# Patient Record
Sex: Female | Born: 1975 | Race: White | Hispanic: No | Marital: Married | Smoking: Never smoker
Health system: Southern US, Community
[De-identification: ages and names within clinical notes are randomized; demographics above are authoritative.]

## PROBLEM LIST (undated history)

## (undated) ENCOUNTER — Inpatient Hospital Stay (HOSPITAL_COMMUNITY): Payer: Self-pay

## (undated) DIAGNOSIS — D649 Anemia, unspecified: Secondary | ICD-10-CM

## (undated) DIAGNOSIS — S3992XA Unspecified injury of lower back, initial encounter: Secondary | ICD-10-CM

## (undated) DIAGNOSIS — Z8632 Personal history of gestational diabetes: Secondary | ICD-10-CM

## (undated) DIAGNOSIS — O09299 Supervision of pregnancy with other poor reproductive or obstetric history, unspecified trimester: Secondary | ICD-10-CM

## (undated) DIAGNOSIS — Z8781 Personal history of (healed) traumatic fracture: Secondary | ICD-10-CM

## (undated) DIAGNOSIS — S52501A Unspecified fracture of the lower end of right radius, initial encounter for closed fracture: Secondary | ICD-10-CM

## (undated) DIAGNOSIS — R112 Nausea with vomiting, unspecified: Secondary | ICD-10-CM

## (undated) DIAGNOSIS — Z9889 Other specified postprocedural states: Secondary | ICD-10-CM

## (undated) DIAGNOSIS — R51 Headache: Secondary | ICD-10-CM

## (undated) DIAGNOSIS — Z87442 Personal history of urinary calculi: Secondary | ICD-10-CM

## (undated) HISTORY — DX: Anemia, unspecified: D64.9

## (undated) HISTORY — DX: Unspecified injury of lower back, initial encounter: S39.92XA

## (undated) HISTORY — DX: Personal history of urinary calculi: Z87.442

## (undated) HISTORY — PX: WISDOM TOOTH EXTRACTION: SHX21

## (undated) HISTORY — DX: Supervision of pregnancy with other poor reproductive or obstetric history, unspecified trimester: O09.299

## (undated) HISTORY — DX: Personal history of (healed) traumatic fracture: Z87.81

## (undated) HISTORY — PX: TONSILLECTOMY AND ADENOIDECTOMY: SHX28

## (undated) HISTORY — DX: Headache: R51

## (undated) HISTORY — DX: Personal history of gestational diabetes: Z86.32

---

## 2001-04-23 HISTORY — PX: CERVIX SURGERY: SHX593

## 2009-11-29 ENCOUNTER — Ambulatory Visit (HOSPITAL_COMMUNITY): Admission: AD | Admit: 2009-11-29 | Discharge: 2009-11-29 | Payer: Self-pay | Admitting: Obstetrics and Gynecology

## 2009-11-30 ENCOUNTER — Ambulatory Visit (HOSPITAL_COMMUNITY): Admission: RE | Admit: 2009-11-30 | Discharge: 2009-11-30 | Payer: Self-pay | Admitting: Obstetrics and Gynecology

## 2010-02-23 ENCOUNTER — Inpatient Hospital Stay (HOSPITAL_COMMUNITY): Admission: AD | Admit: 2010-02-23 | Discharge: 2010-02-23 | Payer: Self-pay | Admitting: Obstetrics and Gynecology

## 2010-02-28 ENCOUNTER — Inpatient Hospital Stay (HOSPITAL_COMMUNITY): Admission: AD | Admit: 2010-02-28 | Discharge: 2010-02-28 | Payer: Self-pay | Admitting: Obstetrics and Gynecology

## 2010-03-04 ENCOUNTER — Inpatient Hospital Stay (HOSPITAL_COMMUNITY): Admission: AD | Admit: 2010-03-04 | Discharge: 2010-03-06 | Payer: Self-pay | Admitting: Obstetrics and Gynecology

## 2010-07-04 LAB — CBC
Hemoglobin: 10.7 g/dL — ABNORMAL LOW (ref 12.0–15.0)
MCH: 31.4 pg (ref 26.0–34.0)
MCHC: 34 g/dL (ref 30.0–36.0)
MCV: 92.3 fL (ref 78.0–100.0)
Platelets: 180 10*3/uL (ref 150–400)
Platelets: 189 10*3/uL (ref 150–400)
RBC: 3.76 MIL/uL — ABNORMAL LOW (ref 3.87–5.11)
RDW: 12.8 % (ref 11.5–15.5)
WBC: 11.5 10*3/uL — ABNORMAL HIGH (ref 4.0–10.5)
WBC: 18.2 10*3/uL — ABNORMAL HIGH (ref 4.0–10.5)

## 2010-07-04 LAB — COMPREHENSIVE METABOLIC PANEL
AST: 28 U/L (ref 0–37)
Albumin: 2.5 g/dL — ABNORMAL LOW (ref 3.5–5.2)
Chloride: 107 mEq/L (ref 96–112)
Creatinine, Ser: 0.51 mg/dL (ref 0.4–1.2)
GFR calc Af Amer: >60 mL/min (ref >60)
Potassium: 3.9 mEq/L (ref 3.5–5.1)
Total Bilirubin: 0.6 mg/dL (ref 0.3–1.2)
Total Protein: 4.7 g/dL — ABNORMAL LOW (ref 6.0–8.3)

## 2011-09-06 ENCOUNTER — Ambulatory Visit (INDEPENDENT_AMBULATORY_CARE_PROVIDER_SITE_OTHER): Payer: BC Managed Care – PPO | Admitting: Obstetrics and Gynecology

## 2011-09-06 ENCOUNTER — Other Ambulatory Visit: Payer: Self-pay | Admitting: Obstetrics and Gynecology

## 2011-09-06 ENCOUNTER — Ambulatory Visit (HOSPITAL_COMMUNITY)
Admission: RE | Admit: 2011-09-06 | Discharge: 2011-09-06 | Disposition: A | Discharge status: Home / Self Care | Payer: BC Managed Care – PPO | Source: Ambulatory Visit | Attending: Obstetrics and Gynecology | Admitting: Obstetrics and Gynecology

## 2011-09-06 DIAGNOSIS — Z3689 Encounter for other specified antenatal screening: Secondary | ICD-10-CM | POA: Insufficient documentation

## 2011-09-06 DIAGNOSIS — Z331 Pregnant state, incidental: Secondary | ICD-10-CM

## 2011-09-06 DIAGNOSIS — O3680X0 Pregnancy with inconclusive fetal viability, not applicable or unspecified: Secondary | ICD-10-CM | POA: Insufficient documentation

## 2011-09-06 NOTE — Progress Notes (Signed)
PANORAMIC DENTAL X-RAY W/SHIELD  07/10/11;   PT UNSURE OF LMP. HAD SPOTTING 08/15/11.  PER VL SCHED U/S FOR DATING/VIABILITY AT [facility]. DECLINES GENETIC SCREENING.

## 2011-09-07 LAB — PRENATAL PANEL VII
Antibody Screen: NEGATIVE
Basophils Absolute: 0 10*3/uL (ref 0.0–0.1)
Basophils Relative: 0 % (ref 0–1)
Eosinophils Absolute: 0.1 10*3/uL (ref 0.0–0.7)
Eosinophils Relative: 1 % (ref 0–5)
Lymphocytes Relative: 21 % (ref 12–46)
MCH: 29.1 pg (ref 26.0–34.0)
MCHC: 34.5 g/dL (ref 30.0–36.0)
MCV: 84.5 fL (ref 78.0–100.0)
Monocytes Absolute: 0.7 10*3/uL (ref 0.1–1.0)
Platelets: 317 10*3/uL (ref 150–400)
RDW: 13.3 % (ref 11.5–15.5)
Rh Type: POSITIVE
WBC: 10.6 10*3/uL — ABNORMAL HIGH (ref 4.0–10.5)

## 2011-09-08 LAB — CULTURE, OB URINE
Colony Count: NO GROWTH
Organism ID, Bacteria: NO GROWTH

## 2011-09-21 ENCOUNTER — Ambulatory Visit (INDEPENDENT_AMBULATORY_CARE_PROVIDER_SITE_OTHER): Payer: BC Managed Care – PPO

## 2011-09-21 VITALS — BP 92/68 | Ht <= 58 in | Wt 132.0 lb

## 2011-09-21 DIAGNOSIS — O26849 Uterine size-date discrepancy, unspecified trimester: Secondary | ICD-10-CM

## 2011-09-21 DIAGNOSIS — Z124 Encounter for screening for malignant neoplasm of cervix: Secondary | ICD-10-CM

## 2011-09-21 DIAGNOSIS — Z331 Pregnant state, incidental: Secondary | ICD-10-CM

## 2011-09-21 DIAGNOSIS — IMO0002 Reserved for concepts with insufficient information to code with codable children: Secondary | ICD-10-CM

## 2011-09-21 LAB — POCT WET PREP (WET MOUNT)

## 2011-09-21 LAB — US OB TRANSVAGINAL

## 2011-09-21 NOTE — Progress Notes (Signed)
C/o pain on left side by ribs Brownish, light pink discharge Thinks she's feeling fetal movement Last pap: March-April 2011/WNL per pt

## 2011-09-24 ENCOUNTER — Telehealth: Payer: Self-pay

## 2011-09-24 NOTE — Telephone Encounter (Signed)
Pt seen Friday 5/31 for NOB and MAB noted on u/s.  Options rev'd at appt, but pt alone and desired to further talk w/ her husband r/e options.  Offered expectant management, vs. Cytotec, vs D&E.  R/b/a rev'd of each of these.  Pt called today and desires to proceed w/ cytotec induction.  U/s on Friday showed 7wk GS only; no fetal pole.  Positive YS.  Quant 5/31=28,155.  Hgb=13.9 on 5/16.  She is O pos.  Bleeding precautions rev'd and disc'd expectations.  Disc'd w/ pt if bleeding heavy, or if incomplete AB, may still need to proceed w/ D&E.  Pt verbalized understanding and risks, and desires to proceed.  Called in Cytotec 200mg ; 4 tabs pv x1 at bedtime tonight; Disp #4 w/ 1RF.  Also called in Motrin and Phenergan per [facility] protocol to use prn.  Rx's called to [facility] in [place] at [phone] at 1345.  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  F/u prn.

## 2011-09-24 NOTE — Telephone Encounter (Signed)
Skeet Simmer has cht/called Hillary to inform her of call from pt.

## 2011-09-25 LAB — PAP IG, CT-NG, RFX HPV ASCU

## 2011-09-27 ENCOUNTER — Telehealth: Payer: Self-pay | Admitting: Obstetrics and Gynecology

## 2011-09-27 NOTE — Telephone Encounter (Signed)
TC from patient--dx with blighted ovum on 5/31, took Cytotech on 6/3, with moderate/heavy bleeding and clots on 6/4, mild cramping. Calling to discuss status. Minimal spotting yesterday, small/moderate amount bleeding today. Feels "fine"--no cramping, nausea, vomiting, or dizziness. Rh + type. Has f/u for [facility] on Saturday (or on Tuesday, based on work schedule)--previous value 28,155 on 5/31.  Issues reviewed, support offered. Reviewed previous USs with patient. Patient will continue to observe status--stable at present. Precautions reviewed.  She will call for any heavy bleeding, severe cramping, or other issues. Feels able to work this weekend, but will call if situation changes.

## 2011-09-29 ENCOUNTER — Other Ambulatory Visit: Payer: Self-pay

## 2011-09-29 DIAGNOSIS — O021 Missed abortion: Secondary | ICD-10-CM | POA: Insufficient documentation

## 2011-09-29 LAB — CBC
MCH: 29.1 pg (ref 26.0–34.0)
MCV: 88.2 fL (ref 78.0–100.0)
Platelets: 298 10*3/uL (ref 150–400)
RBC: 4.4 MIL/uL (ref 3.87–5.11)
RDW: 13 % (ref 11.5–15.5)
WBC: 8.6 10*3/uL (ref 4.0–10.5)

## 2011-09-29 LAB — HCG, QUANTITATIVE, PREGNANCY: hCG, Beta Chain, Quant, S: 5564 m[IU]/mL

## 2011-10-02 ENCOUNTER — Telehealth: Payer: Self-pay | Admitting: Obstetrics and Gynecology

## 2011-10-02 NOTE — Telephone Encounter (Signed)
TC from [facility].   Order for Routine CBC and Quantitative HCG faxed to [facility] [phone].  To call [facility] with results.

## 2011-10-03 NOTE — Progress Notes (Signed)
Quick Note:  S/p cytotec induction 09/24/11. Lab results called to pt. Her VB has been WNL. She is going to have next lab draw at [facility] in [address], Either this Thursday 6/14 or Friday 6/15, and order faxed yesterday by Harriett Sine, RN. Pt lives over one hr away and is going on vacation Saturday, so lab draw easier performed there for pt's convenience. Quant dropped appropriately & cbc stable. ______

## 2011-10-04 ENCOUNTER — Telehealth: Payer: Self-pay

## 2011-10-04 NOTE — Telephone Encounter (Signed)
Tc from pt. Pt states,"no lab order was faxed to facility requested for blood work(see 10/02/11 telephone call). Informed pt will refax lab order. Pt agrees.

## 2011-10-04 NOTE — Telephone Encounter (Signed)
Hillary/triage/follow up

## 2011-10-04 NOTE — Telephone Encounter (Signed)
Lm on vm to cb per telephone call.  

## 2011-10-05 ENCOUNTER — Telehealth: Payer: Self-pay | Admitting: Obstetrics and Gynecology

## 2011-10-05 NOTE — Telephone Encounter (Signed)
TC to pt.  Informed order was faxed to [facility] and received at [phone].  Hospital # [phone].  Will notify [facility]

## 2011-11-12 ENCOUNTER — Other Ambulatory Visit: Payer: Self-pay

## 2011-11-12 ENCOUNTER — Other Ambulatory Visit: Payer: BC Managed Care – PPO

## 2011-11-12 DIAGNOSIS — O039 Complete or unspecified spontaneous abortion without complication: Secondary | ICD-10-CM

## 2011-11-20 ENCOUNTER — Telehealth: Payer: Self-pay

## 2011-11-20 NOTE — Telephone Encounter (Signed)
TC to pt.  Informed of QHCG results of 45.  Per [facility] to send message to [facility] regarding further f/u.   Pt states is having some dizzy spells and is concerned that may be electrolyte imbalance. Requests labs at next blood draw.  Informed may be medical issue and not related to [facility] but will inform [facility].  Denies any bleeding.

## 2011-12-03 ENCOUNTER — Telehealth: Payer: Self-pay | Admitting: Obstetrics and Gynecology

## 2011-12-03 ENCOUNTER — Other Ambulatory Visit: Payer: BC Managed Care – PPO

## 2011-12-03 DIAGNOSIS — O039 Complete or unspecified spontaneous abortion without complication: Secondary | ICD-10-CM

## 2011-12-03 NOTE — Telephone Encounter (Signed)
TC from patient--hx SAB, had last f/u QHCG 3 weeks ago. Just had cycle.   Needs f/u QHCG.  Order entered.  Patient will come to office this pm around 4:14-4:30pm for [facility] only.  Will f/u with patient regarding result. K. Marina Goodell informed, note to lab.

## 2011-12-05 ENCOUNTER — Telehealth: Payer: Self-pay

## 2011-12-05 DIAGNOSIS — O039 Complete or unspecified spontaneous abortion without complication: Secondary | ICD-10-CM

## 2011-12-05 NOTE — Telephone Encounter (Signed)
Spoke with pt informing her [facility] 12/03/11 was 19.7 and per VL another QHCG needs to be drawn until >5. Pt states she works at [employer] and prefers to go to that lab for bloodwork. Informed pt we will fax order today to have [facility] drawn next Sat 8/24. Pt agrees and voices understanding.

## 2011-12-05 NOTE — Telephone Encounter (Signed)
Message copied by Janeece Agee on Wed Dec 05, 2011  4:01 PM ------      Message from: Cornelius Moras      Created: Tue Dec 04, 2011  7:05 AM      Regarding: Lab result to patient       Please call Ms. Frane and inform of QHCG level.. She had an SAB, and last QHCG was 3 weeks ago (we dropped the ball in scheduling another one, so I drew one yesterday).            We still need to follow this to less than 5, so I recommend another QHCG next week at some point.            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            Thanks!      See you tomorrow!      VL      ----- Message -----         From: Lab In [facility]         Sent: 12/04/2011   1:18 AM           To: Nigel Bridgeman, CNM

## 2011-12-15 LAB — HCG, QUANTITATIVE, PREGNANCY: hCG, Beta Chain, Quant, S: 12 m[IU]/mL

## 2011-12-17 ENCOUNTER — Telehealth: Payer: Self-pay | Admitting: Obstetrics and Gynecology

## 2011-12-17 ENCOUNTER — Encounter: Payer: Self-pay | Admitting: Obstetrics and Gynecology

## 2011-12-17 NOTE — Telephone Encounter (Signed)
TC to patient to review QHCG in f/u of missed ab, with Cytotech used 09/24/11. QHCGs gradually decreasing, last on on 8/19 = 12. Previous value on 8/12 = 19.7.  Recommended another QHCG in 1-2 weeks, following till < 5. Patient will do as outpatient at [facility] on or around 9/4. Will send Rx to patient to present to lab when she goes for [facility]. Will have results called to [phone].  Patient still has some spotting. If < 5, and spotting persists, will evaluate. May need [place] to verify no retained POC. Patient will advise [place] of status at that time.

## 2011-12-18 NOTE — Telephone Encounter (Signed)
Spoke with pt informing her will fax order to [facility] today regarding repeat quant due 1-2 wks per VL. Pt agrees and understands.

## 2012-01-01 ENCOUNTER — Telehealth: Payer: Self-pay | Admitting: Obstetrics and Gynecology

## 2012-01-01 ENCOUNTER — Other Ambulatory Visit: Payer: Self-pay | Admitting: Obstetrics and Gynecology

## 2012-01-01 NOTE — Telephone Encounter (Signed)
Lab result  Quant: 3.9

## 2012-04-23 NOTE — L&D Delivery Note (Signed)
Delivery Note  Shortly after pt arrived to room, cervix 8.5cm, AROM for sm amt light MSAF, FHR remained reassuring. Continued to progress rapidly, and pushed in several different positions, left side-lying, lithotomy, then squatting   At 8:31 PM a viable female was delivered via Vaginal, Spontaneous Delivery (Presentation: Right Occiput Anterior).  APGAR: 9, 9; weight 6 lb 15.8 oz (3170 g).   Placenta status: Intact, Spontaneous.  To pathology Cord: 3VC  with the following complications: .  Cord pH: n/a  Anesthesia: None  Episiotomy: None Lacerations:  Suture Repair: n/a Est. Blood Loss (mL): 300  Mom to postpartum.  Baby to nursery-stable. Infant remains skin-skin Pt plans to BF Mom and baby stable in recovery room Routine PP orders Will check fasting CBG in the am   Mykael Trott M 12/27/2012, 10:11 PM

## 2012-05-13 ENCOUNTER — Telehealth: Payer: Self-pay | Admitting: Obstetrics and Gynecology

## 2012-05-19 ENCOUNTER — Telehealth: Payer: Self-pay | Admitting: Obstetrics and Gynecology

## 2012-05-20 NOTE — Telephone Encounter (Signed)
Pt called, is about 7 weeks and states does not have a hx of migraines but has had about 4 of them since current pregnancy.  Pt states has taken 1 325 mg Tylenol only.  Pt advised to try taking 2 Reg or 2 ES Tylenol and can use as directed around the clock, also advised to make sure she is pushing fluids, getting rest, may also drink caffeine when she takes the Tylenol.  Pt voices agreement, will call back if no relief.

## 2012-06-09 ENCOUNTER — Encounter: Payer: Self-pay | Admitting: Obstetrics and Gynecology

## 2012-06-09 ENCOUNTER — Ambulatory Visit: Payer: BC Managed Care – PPO

## 2012-06-09 ENCOUNTER — Other Ambulatory Visit: Payer: Self-pay | Admitting: Obstetrics and Gynecology

## 2012-06-09 ENCOUNTER — Ambulatory Visit: Payer: BC Managed Care – PPO | Admitting: Obstetrics and Gynecology

## 2012-06-09 VITALS — BP 92/60 | Wt 132.0 lb

## 2012-06-09 DIAGNOSIS — N200 Calculus of kidney: Secondary | ICD-10-CM

## 2012-06-09 DIAGNOSIS — O3680X0 Pregnancy with inconclusive fetal viability, not applicable or unspecified: Secondary | ICD-10-CM

## 2012-06-09 DIAGNOSIS — Z331 Pregnant state, incidental: Secondary | ICD-10-CM

## 2012-06-09 DIAGNOSIS — O09529 Supervision of elderly multigravida, unspecified trimester: Secondary | ICD-10-CM | POA: Insufficient documentation

## 2012-06-09 DIAGNOSIS — O09219 Supervision of pregnancy with history of pre-term labor, unspecified trimester: Secondary | ICD-10-CM | POA: Insufficient documentation

## 2012-06-09 DIAGNOSIS — S322XXA Fracture of coccyx, initial encounter for closed fracture: Secondary | ICD-10-CM | POA: Insufficient documentation

## 2012-06-09 DIAGNOSIS — G43909 Migraine, unspecified, not intractable, without status migrainosus: Secondary | ICD-10-CM | POA: Insufficient documentation

## 2012-06-09 DIAGNOSIS — O021 Missed abortion: Secondary | ICD-10-CM

## 2012-06-09 LAB — US OB COMP LESS 14 WKS

## 2012-06-09 NOTE — Progress Notes (Signed)
   Margaret Clay is being seen today for her first obstetrical visit at [redacted]w[redacted]d gestation by LMP.  She reports moderate nausea, sporadic HAs.  Her obstetrical history is significant for: Patient Active Problem List  Diagnosis  . Missed abortion 2013  . AMA (advanced maternal age) multigravida 35+  . Preterm delivery  . Cervical tear resulting from childbirth  . Kidney stone  . Hx of PTL (preterm labor) with 3rd pregnancy, term delivery  . Migraines during pregnancy  . Rapid first stage of labor  . Hx fractured coccyx with 3rd baby  Received 17p with last pregnancy--plans to use this pregnancy.  Last pregnancy, 17P was sent to patient and was administered by family/friends due to patient's distance from the office.  Relationship with FOB:  [name], husband, involved and supportive She is employed as Charity fundraiser, [employer] at [employer]  Feeding plan:   Breast  Pregnancy history fully reviewed.  The following portions of the patient's history were reviewed and updated as appropriate: allergies, current medications, past family history, past medical history, past social history, past surgical history and problem list.  Review of Systems Pertinent ROS is described in HPI   Objective:   LMP 03/25/2012  Breastfeeding? Unknown Wt Readings from Last 1 Encounters:  09/21/11 132 lb (59.875 kg)   BMI: There is no weight on file to calculate BMI.  General: alert, cooperative and no distress HEENT: grossly normal  Thyroid: normal  Respiratory: clear to auscultation bilaterally Cardiovascular: regular rate and rhythm,  Breasts:  No dominant masses, nipples erect Gastrointestinal: soft, non-tender; no masses,  no organomegaly Extremities: extremities normal, no pain or edema Vaginal Bleeding: None  EXTERNAL GENITALIA: normal appearing vulva with no masses, tenderness or lesions VAGINA: no abnormal discharge or lesions CERVIX: no lesions or cervical motion tenderness; cervix closed, long,  firm UTERUS: gravid and feels less than 10 weeks, but lots of gaseous distension of abdomen, making assessment more difficult. ADNEXA: no masses palpable and nontender OB EXAM PELVIMETRY: appears adequate, proven to 7 1/2 lbs  FHR:  164 by [place], with SIUP, 11 weeks 4 days, EDC 12/26/12, c/w dates of sure LMP.  Assessment:    Pregnancy at  11 4/7 weeks by [place], 10 6/7 weeks by sure LMP Hx PTD Hx cervical laceration AMA Hx kidney stone Migraines Rapid labor Plan:     Prenatal panel reviewed and discussed with the patient:  Labs drawn today  Pap smear collected:  No--due 08/2013. GC/Chlamydia collected:  Declined Wet prep:  NA Discussion of Genetic testing options: Declines Prenatal vitamins recommended  Plan of care: Next visit:  4 weeks for ROB Other anticipated f/u:    Per investigation by Charisse March, 17P now available via [facility] in [place]. Patient will investigate cost/dose or vial.  If she wishes to have it dispensed to herself for administration outside the office, we will Rx 17p q week starting at 16 weeks to [facility] via [facility].   She will need to keep log of administration of 17P when occurs outside the office.   Nigel Bridgeman, CNM, [place]

## 2012-06-09 NOTE — Progress Notes (Signed)
[redacted]w[redacted]d Viability Ultrasound: transabdominal images, single fetus,  anteverted uterus, amnion seen CR[facility] within 4 days of LMP GA,  cx closed, Normal ovaries/adnexa

## 2012-06-09 NOTE — Progress Notes (Signed)
[redacted]w[redacted]d Pt has a lot of headches

## 2012-06-10 LAB — PRENATAL PANEL VII
Antibody Screen: NEGATIVE
Basophils Relative: 0 % (ref 0–1)
Eosinophils Absolute: 0.2 10*3/uL (ref 0.0–0.7)
Eosinophils Relative: 1 % (ref 0–5)
Hemoglobin: 12.5 g/dL (ref 12.0–15.0)
Lymphs Abs: 1.9 10*3/uL (ref 0.7–4.0)
MCH: 29.3 pg (ref 26.0–34.0)
MCHC: 34.6 g/dL (ref 30.0–36.0)
MCV: 84.7 fL (ref 78.0–100.0)
Monocytes Absolute: 0.9 10*3/uL (ref 0.1–1.0)
Monocytes Relative: 7 % (ref 3–12)
RBC: 4.26 MIL/uL (ref 3.87–5.11)
Rh Type: POSITIVE
Rubella: 2.41 Index — ABNORMAL HIGH (ref <0.90)

## 2012-06-13 ENCOUNTER — Telehealth: Payer: Self-pay

## 2012-06-13 MED ORDER — HYDROXYPROGESTERONE CAPROATE 250 MG/ML IM OIL: TOPICAL_OIL | INTRAMUSCULAR | Status: DC

## 2012-06-13 NOTE — Telephone Encounter (Signed)
Pt notified that rx will be sent to [facility] per her request.  Instructions to pharmacy are to mail to pt before 1st inj is due.  Also to call patient when mailed.

## 2012-07-11 ENCOUNTER — Ambulatory Visit: Payer: BC Managed Care – PPO | Admitting: Obstetrics and Gynecology

## 2012-07-11 VITALS — BP 88/62 | Wt 134.5 lb

## 2012-07-11 DIAGNOSIS — Z3490 Encounter for supervision of normal pregnancy, unspecified, unspecified trimester: Secondary | ICD-10-CM

## 2012-07-11 NOTE — Progress Notes (Signed)
[redacted]w[redacted]d Pt c/o feeling blood sugar level "out of wack" yesterday. She admits to not eating breakfast. Pt has glucose monitor at home; however, didn't check it yesterday. Pt states she feels better today.

## 2012-07-11 NOTE — Progress Notes (Signed)
Doing well--thinks she might be having variations of her blood sugar. Had "borderline" testing with previous baby, but never dx with GDM. Would like referral to [facility] for diet teaching, due to increased risk of GDM.   Will do early glucola [place] at [facility] for anatomy--Level 2 due to [facility]. Starting 17P at 16 weeks--administering outside the office (at [facility] with colleagues)--will keep log for our verification of administration. Declines genetic testing.

## 2012-08-27 ENCOUNTER — Encounter: Payer: BC Managed Care – PPO | Attending: Obstetrics and Gynecology | Admitting: Dietician

## 2012-08-27 VITALS — Ht <= 58 in | Wt 146.7 lb

## 2012-08-27 DIAGNOSIS — O99810 Abnormal glucose complicating pregnancy: Secondary | ICD-10-CM | POA: Insufficient documentation

## 2012-08-27 DIAGNOSIS — Z713 Dietary counseling and surveillance: Secondary | ICD-10-CM | POA: Insufficient documentation

## 2012-08-28 ENCOUNTER — Encounter: Payer: Self-pay | Admitting: Dietician

## 2012-08-28 NOTE — Progress Notes (Signed)
  Patient was seen on 08/27/2012 for Gestational Diabetes self-management class at [facility]. The following learning objectives were met by the patient during this course:   States the definition of Gestational Diabetes  States why dietary management is important in controlling blood glucose  Describes the effects each nutrient has on blood glucose levels  Demonstrates ability to create a balanced meal plan  Demonstrates carbohydrate counting   States when to check blood glucose levels  Demonstrates proper blood glucose monitoring techniques  States the effect of stress and exercise on blood glucose levels  States the importance of limiting caffeine and abstaining from alcohol and smoking  Blood glucose monitor given: [employer] # [id number]  Exp: 2015/03 Blood glucose reading: 6:15 PM 90 mg pre-meal  Patient instructed to monitor glucose levels: FBS: 60 - <90 2 hour: <120  Patient received handouts:  Nutrition Diabetes and Pregnancy  Carbohydrate Counting List  Patient will be seen for follow-up as needed.

## 2012-12-10 NOTE — Progress Notes (Signed)
..   Subjective:    Margaret Clay is being seen today for her first obstetrical visit.  She is [redacted]w[redacted]d determined by u/s she had at [facility] on 09/06/11 which showed GS measuring [redacted]w[redacted]d with no Fetal pole. Patient's last menstrual period was 05/12/2011.  Ultrasound: YES on 09/06/11.  Relationship w FOB: married  She reports spotting on 08/15/11, and recently brown/light pink d/c again.  She denies nausea/vomiting.  C/o Lt side pain, by her rib cage  Her obstetrical history is significant for: 1. AMA 2. H/o PTD at 33 weeks w/ G1 3. H/o PTL, but term deliveries since G1(on 17-p last preg) 4. H/o migraines 5. H/o cervical tear w/ delivery 6. H/o rapid 1st stage of labor 7. H/o kidney stone 8. H/o broken coccyx  Review of Systems Pertinent ROS is described in HPI   Objective:   BP 92/68  Ht 4\' 9"  (1.448 m)  Wt 132 lb (59.875 kg)  BMI 28.56 kg/m2  LMP 05/12/2011 Wt Readings from Last 1 Encounters:  08/28/12 146 lb 11.2 oz (66.543 kg)   BMI: Body mass index is 28.56 kg/(m^2).  General: alert, cooperative and no distress HEENT: grossly normal  Thyroid: normal  Respiratory: clear to auscultation bilaterally Cardiovascular: regular rate and rhythm  Breasts:  No dominant masses, nipples erect Gastrointestinal: soft, non-tender; no masses,  no organomegaly Extremities: extremities normal, no pain or edema   EXTERNAL GENITALIA: normal appearing vulva with no masses, tenderness or lesions VAGINA: no abnormal discharge, bleeding or lesions CERVIX: no lesions or cervical motion tenderness; cervix closed, long, firm UTERUS: gravid and consistent with 6-8 weeks ADNEXA: no masses palpable and nontender OB EXAM PELVIMETRY: appears adequate  U/S: irregular GS measuring 7weeks; no Fetal pole; positive YS.  nml ovaries and adnexa;   Anteverted uterus. Assessment:    Failed Pregnancy Irregular GS w/ No FP measuring 7weeks on u/s today (2nd u/s w/ no FP) O pos AMA Plan:     Prenatal  labs rv'd--nml on 09/06/11 Pap smear collected:  yes GC/Chlamydia collected:  yes Wet prep:  Not done  Discussion of Genetic testing options: declines Problem list reviewed and updated. rv'd how and when to call for emergencies   Rev'd u/s and failed pregnancy w/ pt.  Given options of expectant management, cytotec induction, Or D&E.  R/b/a rev'd of all of these, and pt desires to further discuss w/ her husband and contact us in  The next 24 hrs w/ decision.  Bleeding precautions rev'd w/ pt.  If desires expectant management, needs weekly quants if active miscarriage ensues.     Rexene Edison, CNM

## 2012-12-25 ENCOUNTER — Telehealth (HOSPITAL_COMMUNITY): Payer: Self-pay | Admitting: *Deleted

## 2012-12-25 ENCOUNTER — Encounter (HOSPITAL_COMMUNITY): Payer: Self-pay | Admitting: *Deleted

## 2012-12-25 NOTE — Telephone Encounter (Signed)
Preadmission screen  

## 2012-12-27 ENCOUNTER — Encounter (HOSPITAL_COMMUNITY): Payer: Self-pay | Admitting: *Deleted

## 2012-12-27 ENCOUNTER — Inpatient Hospital Stay (HOSPITAL_COMMUNITY)
Admission: AD | Admit: 2012-12-27 | Discharge: 2012-12-29 | DRG: 373 | Disposition: A | Discharge status: Home / Self Care | Payer: BC Managed Care – PPO | Source: Ambulatory Visit | Attending: Obstetrics and Gynecology | Admitting: Obstetrics and Gynecology

## 2012-12-27 DIAGNOSIS — O09529 Supervision of elderly multigravida, unspecified trimester: Secondary | ICD-10-CM | POA: Diagnosis present

## 2012-12-27 DIAGNOSIS — O878 Other venous complications in the puerperium: Principal | ICD-10-CM | POA: Diagnosis present

## 2012-12-27 DIAGNOSIS — K649 Unspecified hemorrhoids: Secondary | ICD-10-CM | POA: Diagnosis present

## 2012-12-27 LAB — CBC
HCT: 36.7 % (ref 36.0–46.0)
Hemoglobin: 12.9 g/dL (ref 12.0–15.0)
RDW: 12.8 % (ref 11.5–15.5)
WBC: 12.9 10*3/uL — ABNORMAL HIGH (ref 4.0–10.5)

## 2012-12-27 MED ORDER — LIDOCAINE HCL (PF) 1 % IJ SOLN
INTRAMUSCULAR | Status: AC
  Filled 2012-12-27: qty 30

## 2012-12-27 MED ORDER — OXYCODONE-ACETAMINOPHEN 5-325 MG PO TABS: 1.0000 | ORAL_TABLET | ORAL | Status: DC | PRN

## 2012-12-27 MED ORDER — LANOLIN HYDROUS EX OINT: TOPICAL_OINTMENT | CUTANEOUS | Status: DC | PRN

## 2012-12-27 MED ORDER — ONDANSETRON HCL 4 MG/2ML IJ SOLN: 4.0000 mg | Freq: Four times a day (QID) | INTRAMUSCULAR | Status: DC | PRN

## 2012-12-27 MED ORDER — FLEET ENEMA 7-19 GM/118ML RE ENEM: 1.0000 | ENEMA | RECTAL | Status: DC | PRN

## 2012-12-27 MED ORDER — DIPHENHYDRAMINE HCL 25 MG PO CAPS: 25.0000 mg | ORAL_CAPSULE | Freq: Four times a day (QID) | ORAL | Status: DC | PRN

## 2012-12-27 MED ORDER — BENZOCAINE-MENTHOL 20-0.5 % EX AERO: 1.0000 "application " | INHALATION_SPRAY | CUTANEOUS | Status: DC | PRN

## 2012-12-27 MED ORDER — SIMETHICONE 80 MG PO CHEW: 80.0000 mg | CHEWABLE_TABLET | ORAL | Status: DC | PRN

## 2012-12-27 MED ORDER — PRENATAL MULTIVITAMIN CH: 1.0000 | ORAL_TABLET | Freq: Every day | ORAL | Status: DC

## 2012-12-27 MED ORDER — OXYTOCIN BOLUS FROM INFUSION: 500.0000 mL | INTRAVENOUS | Status: DC

## 2012-12-27 MED ORDER — CITRIC ACID-SODIUM CITRATE 334-500 MG/5ML PO SOLN: 30.0000 mL | ORAL | Status: DC | PRN

## 2012-12-27 MED ORDER — LIDOCAINE HCL (PF) 1 % IJ SOLN
30.0000 mL | INTRAMUSCULAR | Status: DC | PRN
  Filled 2012-12-27: qty 30

## 2012-12-27 MED ORDER — BUTORPHANOL TARTRATE 1 MG/ML IJ SOLN: 1.0000 mg | INTRAMUSCULAR | Status: DC | PRN

## 2012-12-27 MED ORDER — BISACODYL 10 MG RE SUPP: 10.0000 mg | Freq: Every day | RECTAL | Status: DC | PRN

## 2012-12-27 MED ORDER — TETANUS-DIPHTH-ACELL PERTUSSIS 5-2.5-18.5 LF-MCG/0.5 IM SUSP: 0.5000 mL | Freq: Once | INTRAMUSCULAR | Status: DC

## 2012-12-27 MED ORDER — IBUPROFEN 600 MG PO TABS
600.0000 mg | ORAL_TABLET | Freq: Four times a day (QID) | ORAL | Status: DC
  Administered 2012-12-28 – 2012-12-29 (×5): 600 mg via ORAL
  Filled 2012-12-27 (×4): qty 1

## 2012-12-27 MED ORDER — ONDANSETRON HCL 4 MG/2ML IJ SOLN: 4.0000 mg | INTRAMUSCULAR | Status: DC | PRN

## 2012-12-27 MED ORDER — LACTATED RINGERS IV SOLN: INTRAVENOUS | Status: DC

## 2012-12-27 MED ORDER — LACTATED RINGERS IV SOLN: 500.0000 mL | INTRAVENOUS | Status: DC | PRN

## 2012-12-27 MED ORDER — ACETAMINOPHEN 325 MG PO TABS: 650.0000 mg | ORAL_TABLET | ORAL | Status: DC | PRN

## 2012-12-27 MED ORDER — MEASLES, MUMPS & RUBELLA VAC ~~LOC~~ INJ
0.5000 mL | INJECTION | Freq: Once | SUBCUTANEOUS | Status: DC
  Filled 2012-12-27: qty 0.5

## 2012-12-27 MED ORDER — SENNOSIDES-DOCUSATE SODIUM 8.6-50 MG PO TABS
2.0000 | ORAL_TABLET | Freq: Every day | ORAL | Status: DC
  Administered 2012-12-28 (×2): 2 via ORAL

## 2012-12-27 MED ORDER — OXYTOCIN 10 UNIT/ML IJ SOLN
INTRAMUSCULAR | Status: AC
  Filled 2012-12-27: qty 1

## 2012-12-27 MED ORDER — ZOLPIDEM TARTRATE 5 MG PO TABS: 5.0000 mg | ORAL_TABLET | Freq: Every evening | ORAL | Status: DC | PRN

## 2012-12-27 MED ORDER — IBUPROFEN 600 MG PO TABS
600.0000 mg | ORAL_TABLET | Freq: Four times a day (QID) | ORAL | Status: DC | PRN
  Administered 2012-12-27: 600 mg via ORAL
  Filled 2012-12-27: qty 1

## 2012-12-27 MED ORDER — WITCH HAZEL-GLYCERIN EX PADS: 1.0000 "application " | MEDICATED_PAD | CUTANEOUS | Status: DC | PRN

## 2012-12-27 MED ORDER — ONDANSETRON HCL 4 MG PO TABS: 4.0000 mg | ORAL_TABLET | ORAL | Status: DC | PRN

## 2012-12-27 MED ORDER — OXYTOCIN 40 UNITS IN LACTATED RINGERS INFUSION - SIMPLE MED: 62.5000 mL/h | INTRAVENOUS | Status: DC

## 2012-12-27 MED ORDER — OXYTOCIN 40 UNITS IN LACTATED RINGERS INFUSION - SIMPLE MED
INTRAVENOUS | Status: AC
  Filled 2012-12-27: qty 1000

## 2012-12-27 MED ORDER — FLEET ENEMA 7-19 GM/118ML RE ENEM: 1.0000 | ENEMA | Freq: Every day | RECTAL | Status: DC | PRN

## 2012-12-27 MED ORDER — MISOPROSTOL 200 MCG PO TABS
ORAL_TABLET | ORAL | Status: AC
  Filled 2012-12-27: qty 5

## 2012-12-27 MED ORDER — DIBUCAINE 1 % RE OINT
1.0000 "application " | TOPICAL_OINTMENT | RECTAL | Status: DC | PRN
  Administered 2012-12-29: 1 via RECTAL
  Filled 2012-12-27: qty 28

## 2012-12-27 NOTE — Progress Notes (Signed)
Patient brought to room 168 via stretcher from MAU.

## 2012-12-27 NOTE — Progress Notes (Signed)
Sanda Klein CNM notified of pt's admission. RN to check pt. Manfred Arch CNM on unit and in to check pt.

## 2012-12-27 NOTE — Progress Notes (Signed)
Report called to Annabelle Harman RN in Bs regarding pt's status

## 2012-12-27 NOTE — H&P (Signed)
Margaret Clay is a 37 y.o. female presenting for onset of labor at 24w4. She reports bloody show and no LOF, GFM.   Pregnancy significant for:  1. Hx PTD, on 17p 2. GDM - on glyburide BID 3. AMA 4. Hx PPH and cervical lac 5. Hx recent missed AB, took cytotec    HPI: Pt began PNC at [facility] at 10wks, [facility] determined by LMP =9/9 Viability [place] c/w dating Declined genetic testing Began weekly 17p injections at 16wks Early 1hr gtt at 18wks elevated, 3hr gtt w 2 abnormal, went to diabetic teaching Anatomy US at 19wks, LLP, bilateral CP cysts  [place] at 28wks, LLP resolved, CPC resolved, normal growth Korea at 33wks, EFW 68%, AC 92% @33wks , Began glyburide 2.5mg  @HS , due to persistently elevated CBG's despite dietary modifications At 35wks, CBG's remained elevated, glyburide increased to BID [place] at 39wks EFW 8#1oz, BPP 8/8    Maternal Medical History:  Reason for admission: Contractions.   Contractions: Onset was 6-12 hours ago.   Frequency: regular.   Duration is approximately 60 seconds.   Perceived severity is strong.    Fetal activity: Perceived fetal activity is normal.   Last perceived fetal movement was within the past hour.    Prenatal complications: no prenatal complications Prenatal Complications - Diabetes: gestational. Diabetes is managed by oral agent (monotherapy).      OB History   Grav Para Term Preterm Abortions TAB SAB Ect Mult Living   6 5 4 1 1  1   5      Obstetric Comments   1ST BABY IN NICU 49 DAYS     G1 - 9/00 PTD 33wks SVD G2 - 3/03 40w SVD, PPH, cervical lac, OR for repair G3 - 10/05 38w SVD, PTL, fx coccyx, PPH G4 - 11/11 40w borderline GDM, on 17p, NICU for pneumonia G5 - 1/13 SAB G6 - current preg    Past Medical History  Diagnosis Date  . Preterm labor 2000; 2005  . Infection     OCC YEAST  . Anemia 2003     DUE TO PP HEMORRHAGE  . Chronic kidney disease 2005    KIDNEY STONES  . Headache(784.0)     FREQUENT  . Recurrent upper  respiratory infection (URI)     HX Q 3-4 WEEKS; SEASONAL  . GERD (gastroesophageal reflux disease)     REFLUX DURING PREG  . Ovarian cyst     RECURRENT  . Low iron     Post delivery  . History of kidney stones   . History of chicken pox   . Missed abortion 09/29/2011    cytotec induction 09/24/11;  Quant & CBC 09/29/11 outpatient at [facility]  . Hx of maternal cervical laceration, currently pregnant   . Gestational diabetes 2011    glyburide   Past Surgical History  Procedure Laterality Date  . Tonsillectomy      AGE 58  . Adenoidectomy      AGE 58  . Wisdom tooth extraction      AGE 42  . Cervical repair  2003   Family History: family history includes Arthritis in her mother and paternal grandmother; Cancer in her maternal grandmother, paternal grandfather, and paternal grandmother; Diabetes in her maternal grandmother; Heart disease in her paternal grandfather; Hypertension in her father; Kidney disease in her brother and maternal grandfather; Other in her maternal grandmother; Stroke in her maternal grandmother and paternal grandfather. Social History:  reports that she has never smoked. She has never used  smokeless tobacco. She reports that she does not drink alcohol or use illicit drugs.   Prenatal Transfer Tool  Maternal Diabetes: Yes:  Diabetes Type:  Insulin/Medication controlled Genetic Screening: Declined Maternal Ultrasounds/Referrals: Normal Fetal Ultrasounds or other Referrals:  None Maternal Substance Abuse:  No Significant Maternal Medications:  Meds include: Progesterone Other: on 17p weekly 16-36w, glyburide BID Significant Maternal Lab Results:  Lab values include: Group B Strep negative Other Comments:  None  Review of Systems  All other systems reviewed and are negative.    Dilation: 10 Effacement (%): 100 Station: -1 Exam by:: S. Caide Campi, CNM Blood pressure 118/69, pulse 64, temperature 97.6 F (36.4 C), temperature source Oral, resp. rate 20, height 4\' 9"   (1.448 Clay), weight 151 lb (68.493 kg), last menstrual period 03/25/2012, currently breastfeeding. Maternal Exam:  Uterine Assessment: Contraction strength is firm.  Contraction duration is 60 seconds. Contraction frequency is regular.   Abdomen: Patient reports no abdominal tenderness. Fundal height is aga.   Estimated fetal weight is 8#.   Fetal presentation: vertex  Introitus: Normal vulva. Normal vagina.  Pelvis: adequate for delivery.   Cervix: Cervix evaluated by digital exam.     Fetal Exam Fetal Monitor Review: Mode: ultrasound.   Baseline rate: 140.  Variability: moderate (6-25 bpm).   Pattern: accelerations present and no decelerations.    Fetal State Assessment: Category I - tracings are normal.     Physical Exam  Nursing note and vitals reviewed. Constitutional: She is oriented to person, place, and time. She appears well-developed and well-nourished.  HENT:  Head: Normocephalic.  Eyes: Pupils are equal, round, and reactive to light.  Neck: Normal range of motion.  Cardiovascular: Normal rate, regular rhythm and normal heart sounds.   Respiratory: Effort normal and breath sounds normal.  GI: Soft. Bowel sounds are normal.  Genitourinary: Vagina normal.  Musculoskeletal: Normal range of motion.  Neurological: She is alert and oriented to person, place, and time. She has normal reflexes.  Skin: Skin is warm and dry.  Psychiatric: She has a normal mood and affect. Her behavior is normal.    Prenatal labs: ABO, Rh: O/POS/-- (02/17 1523) Antibody: NEG (02/17 1523) Rubella: 2.41 (02/17 1523) RPR: NON REAC (02/17 1523)  HBsAg: NEGATIVE (02/17 1523)  HIV: NON REACTIVE (02/17 1523)  GBS: Negative (08/06 0000)  1hr gtt =158 4/15 3hr gtt =87, 211, 177, 72   Assessment/Plan: IUP at [redacted]w[redacted]d GBS neg FHR reassuring Active labor GDM on glyburide    Admit to b.s. Per c/w Dr Pennie Rushing Routine L&D orders CBG now =145, recheck in 1 hr (if not delivered) if remains >120  will begin glucomander  Anticipate NSVD   Margaret Clay 12/27/2012, 9:30 PM

## 2012-12-27 NOTE — MAU Note (Addendum)
PT ARRIVED  VERY UNCOMFORTABLE- BROUGHT  TO RM  6.

## 2012-12-27 NOTE — Progress Notes (Signed)
Vance Gather called initially at 1930 and then recalled at 1939 with update of sve from V. Latham and pt's room number in Bs.

## 2012-12-28 LAB — GLUCOSE, CAPILLARY: Glucose-Capillary: 79 mg/dL (ref 70–99)

## 2012-12-28 LAB — CBC
MCH: 29.5 pg (ref 26.0–34.0)
MCV: 86.4 fL (ref 78.0–100.0)
Platelets: 173 10*3/uL (ref 150–400)
RBC: 3.76 MIL/uL — ABNORMAL LOW (ref 3.87–5.11)
RDW: 12.8 % (ref 11.5–15.5)

## 2012-12-28 MED ORDER — CALCIUM CARBONATE ANTACID 500 MG PO CHEW
1.0000 | CHEWABLE_TABLET | Freq: Four times a day (QID) | ORAL | Status: DC | PRN
Start: 2012-12-28 — End: 2012-12-28
  Administered 2012-12-28: 400 mg via ORAL
  Administered 2012-12-28: 200 mg via ORAL
  Administered 2012-12-28: 16:00:00 via ORAL
  Filled 2012-12-28: qty 2
  Filled 2012-12-28: qty 1
  Filled 2012-12-28: qty 2

## 2012-12-28 MED ORDER — CALCIUM CARBONATE ANTACID 500 MG PO CHEW
2.0000 | CHEWABLE_TABLET | Freq: Four times a day (QID) | ORAL | Status: DC | PRN
  Administered 2012-12-28 – 2012-12-29 (×2): 400 mg via ORAL
  Filled 2012-12-28 (×4): qty 1

## 2012-12-28 MED ORDER — PANTOPRAZOLE SODIUM 40 MG PO TBEC
40.0000 mg | DELAYED_RELEASE_TABLET | Freq: Every day | ORAL | Status: DC
  Administered 2012-12-28 – 2012-12-29 (×2): 40 mg via ORAL
  Filled 2012-12-28 (×2): qty 1

## 2012-12-28 NOTE — Progress Notes (Signed)
Post Partum Day 1: S/P SVD with no lacerations  Subjective: Patient up ad lib, denies syncope or dizziness. + flatus and voiding without difficulty. C/o heartburn sx and asked for medication, was given tums without relief.  Also c/o feeling very tired d/t cluster feedings last night. Feeding:  Breastfeeding Contraceptive plan:   No short term plans at this time, but FOB may get a vasectomy in the future.  Objective: Blood pressure 91/54, pulse 71, temperature 98 F (36.7 C), temperature source Oral, resp. rate 20, height 4\' 9"  (1.448 m), weight 151 lb (68.493 kg), last menstrual period 03/25/2012, SpO2 99.00%, currently breastfeeding.  Physical Exam:  General: alert, cooperative and fatigued Lochia: appropriate Uterine Fundus: firm Incision: healing well DVT Evaluation: No evidence of DVT seen on physical exam. Negative Homan's sign.   Recent Labs  12/27/12 2000 12/28/12 0625  HGB 12.9 11.1*  HCT 36.7 32.5*    Assessment/Plan: S/P Vaginal delivery day 1 Continue current care Order Protonix 40 mg QD Plan for discharge tomorrow   LOS: 1 day   Margaret Clay 12/28/2012, 8:48 AM

## 2012-12-29 LAB — RPR: RPR Ser Ql: NONREACTIVE

## 2012-12-29 MED ORDER — HYDROCORTISONE ACETATE 25 MG RE SUPP: 25.0000 mg | Freq: Two times a day (BID) | RECTAL | Status: DC

## 2012-12-29 MED ORDER — OXYCODONE-ACETAMINOPHEN 5-325 MG PO TABS: 1.0000 | ORAL_TABLET | Freq: Four times a day (QID) | ORAL | Status: DC | PRN

## 2012-12-29 MED ORDER — NORETHINDRONE 0.35 MG PO TABS: 1.0000 | ORAL_TABLET | Freq: Every day | ORAL | Status: DC

## 2012-12-29 MED ORDER — IBUPROFEN 600 MG PO TABS: 600.0000 mg | ORAL_TABLET | Freq: Four times a day (QID) | ORAL | Status: DC | PRN

## 2012-12-29 NOTE — Discharge Summary (Signed)
Obstetric Discharge Summary Reason for Admission: onset of labor Prenatal Procedures: ultrasound and monitoring of CBGs Intrapartum Procedures: spontaneous vaginal delivery Postpartum Procedures: none Complications-Operative and Postpartum: none Hemoglobin  Date Value Range Status  12/28/2012 11.1* 12.0 - 15.0 g/dL Final     HCT  Date Value Range Status  12/28/2012 32.5* 36.0 - 46.0 % Final   Pt requests something for hemorrhoids that she usually gets PP.  She also reports reflux but has zantac at home that she will take.  She is ready for discharge.  Says bleeding is better and is ambulating well and tolerating po.  Plans to BF.  Husband plans to get vasectomy but still wants micronor in the interim.  Physical Exam:  General: alert and no distress Lochia: appropriate Uterine Fundus: firm Incision: n/a DVT Evaluation: No evidence of DVT seen on physical exam.  Discharge Diagnoses: Term Pregnancy-delivered  Discharge Information: Date: 12/29/2012 Activity: pelvic rest Diet: routine Medications: Ibuprofen, Percocet and Micronor, Proctosol and Zantac OTC Condition: stable Instructions: refer to practice specific booklet Discharge to: home Follow-up Information   Follow up with [facility] In 6 weeks. (for PP visit)    Contact information:   [address] [place] [address]       Newborn Data: Live born female  Birth Weight: 6 lb 15.8 oz (3170 g) APGAR: 9, 9  Home with mother.  Ave Scharnhorst Y 12/29/2012, 8:26 AM

## 2012-12-30 ENCOUNTER — Inpatient Hospital Stay (HOSPITAL_COMMUNITY): Admission: RE | Admit: 2012-12-30 | Payer: BC Managed Care – PPO | Source: Ambulatory Visit

## 2014-02-22 ENCOUNTER — Encounter (HOSPITAL_COMMUNITY): Payer: Self-pay | Admitting: *Deleted

## 2014-06-10 ENCOUNTER — Other Ambulatory Visit: Payer: Self-pay

## 2014-06-11 ENCOUNTER — Other Ambulatory Visit: Payer: Self-pay

## 2014-06-11 ENCOUNTER — Other Ambulatory Visit (HOSPITAL_COMMUNITY)
Admit: 2014-06-11 | Discharge: 2014-06-11 | Disposition: A | Discharge status: Home / Self Care | Payer: BLUE CROSS/BLUE SHIELD | Source: Ambulatory Visit | Attending: Obstetrics and Gynecology | Admitting: Obstetrics and Gynecology

## 2014-06-11 DIAGNOSIS — O039 Complete or unspecified spontaneous abortion without complication: Secondary | ICD-10-CM | POA: Diagnosis present

## 2014-06-11 LAB — HCG, QUANTITATIVE, PREGNANCY: HCG, BETA CHAIN, QUANT, S: 4 m[IU]/mL (ref <5)

## 2015-02-02 ENCOUNTER — Ambulatory Visit: Payer: BLUE CROSS/BLUE SHIELD

## 2015-02-23 ENCOUNTER — Encounter: Payer: BLUE CROSS/BLUE SHIELD | Attending: Obstetrics and Gynecology

## 2015-02-23 VITALS — Ht <= 58 in | Wt 150.7 lb

## 2015-02-23 DIAGNOSIS — R7302 Impaired glucose tolerance (oral): Secondary | ICD-10-CM | POA: Diagnosis not present

## 2015-02-23 DIAGNOSIS — Z713 Dietary counseling and surveillance: Secondary | ICD-10-CM | POA: Insufficient documentation

## 2015-02-23 DIAGNOSIS — R7309 Other abnormal glucose: Secondary | ICD-10-CM

## 2015-02-23 NOTE — Progress Notes (Signed)
  Patient was seen on 02/23/15 for Gestational Diabetes self-management class at the [facility]. The following learning objectives were met by the patient during this course:   States the definition of Gestational Diabetes  States why dietary management is important in controlling blood glucose  Describes the effects each nutrient has on blood glucose levels  Demonstrates ability to create a balanced meal plan  Demonstrates carbohydrate counting   States when to check blood glucose levels  Demonstrates proper blood glucose monitoring techniques  States the effect of stress and exercise on blood glucose levels  States the importance of limiting caffeine and abstaining from alcohol and smoking  Blood glucose monitor given:  One Nurse, learning disability Kit Lot # [id number] X Exp: 03/2016 Blood glucose reading: 117 mg/dl  Patient instructed to monitor glucose levels: FBS: 60 - <90 2 hour: <120  *Patient received handouts:  Nutrition Diabetes and Pregnancy  Carbohydrate Counting List  Patient will be seen for follow-up as needed.

## 2015-04-24 NOTE — L&D Delivery Note (Signed)
Delivery Note 1915: Nurse call and requests provider presence for delivery.  In room to assess.  Nurse reports patient 8/100/+1.  Patient with involuntary pushing and provider prepped for delivery.  Patient delivered as below with staff and family support.   At 7:30 PM, on Jun 12, 2015, a viable female "Varney Baas" was delivered via Vaginal, Spontaneous Delivery (Presentation: Middle Occiput Anterior with restitution to LOT).   After delivery of head, nuchal cord noted that shoulders and body was delivered through via somersault maneuver. Infant with good tone and minimal grimace which improved with tactile stimulation and bulb suction by provider.  Infant placed on mother's abdomen where nurses continued tactile stimulation.  Infant APGAR: 7, 9.  Cord clamped, cut, and blood collected for private donation. Placenta delivered spontaneously and noted to be intact with 3VC upon inspection.  Placenta to pathology secondary to GDM. Vaginal inspection revealed no lacerations.  Fundus firm, at the umbilicus, and bleeding small.  Mother hemodynamically stable and infant skin to skin prior to provider exit.  Mother unsure of birth control method and opts to breastfeed.  Infant weight at one hour of life: 8lbs 5.3oz, 19.5in.   Anesthesia: None  Episiotomy: None Lacerations: None Suture Repair: None Est. Blood Loss (mL): 200  Mom to postpartum.  Baby to Couplet care / Skin to Skin.  Cherre Robins MSN, CNM 06/12/2015, 8:06 PM   Addendum Nurse reports fundus boggy, but firms with massage and passing of a few clots.  Bleeding small after incident. Patient started on methergine tablets x 24 hours secondary to h/o PPH.

## 2015-05-04 LAB — OB RESULTS CONSOLE RPR: RPR: NONREACTIVE

## 2015-05-04 LAB — OB RESULTS CONSOLE GC/CHLAMYDIA
CHLAMYDIA, DNA PROBE: NEGATIVE
Gonorrhea: NEGATIVE

## 2015-05-04 LAB — OB RESULTS CONSOLE ABO/RH: RH TYPE: POSITIVE

## 2015-05-04 LAB — OB RESULTS CONSOLE HEPATITIS B SURFACE ANTIGEN: HEP B S AG: NEGATIVE

## 2015-05-04 LAB — OB RESULTS CONSOLE HIV ANTIBODY (ROUTINE TESTING): HIV: NONREACTIVE

## 2015-05-04 LAB — OB RESULTS CONSOLE RUBELLA ANTIBODY, IGM: Rubella: IMMUNE

## 2015-05-04 LAB — OB RESULTS CONSOLE ANTIBODY SCREEN: Antibody Screen: NEGATIVE

## 2015-05-18 LAB — OB RESULTS CONSOLE GBS: STREP GROUP B AG: NEGATIVE

## 2015-06-03 ENCOUNTER — Encounter (HOSPITAL_COMMUNITY): Payer: Self-pay | Admitting: *Deleted

## 2015-06-03 ENCOUNTER — Telehealth (HOSPITAL_COMMUNITY): Payer: Self-pay | Admitting: *Deleted

## 2015-06-03 NOTE — Telephone Encounter (Signed)
Preadmission screen  

## 2015-06-10 ENCOUNTER — Inpatient Hospital Stay (HOSPITAL_COMMUNITY): Admission: RE | Admit: 2015-06-10 | Payer: BLUE CROSS/BLUE SHIELD | Source: Ambulatory Visit

## 2015-06-10 ENCOUNTER — Other Ambulatory Visit: Payer: Self-pay | Admitting: Obstetrics & Gynecology

## 2015-06-12 ENCOUNTER — Encounter (HOSPITAL_COMMUNITY): Payer: Self-pay

## 2015-06-12 ENCOUNTER — Inpatient Hospital Stay (HOSPITAL_COMMUNITY)
Admission: RE | Admit: 2015-06-12 | Discharge: 2015-06-14 | DRG: 775 | Disposition: A | Discharge status: Home / Self Care | Payer: BLUE CROSS/BLUE SHIELD | Source: Ambulatory Visit | Attending: Obstetrics & Gynecology | Admitting: Obstetrics & Gynecology

## 2015-06-12 ENCOUNTER — Inpatient Hospital Stay (HOSPITAL_COMMUNITY): Payer: BLUE CROSS/BLUE SHIELD

## 2015-06-12 DIAGNOSIS — K219 Gastro-esophageal reflux disease without esophagitis: Secondary | ICD-10-CM | POA: Diagnosis present

## 2015-06-12 DIAGNOSIS — O24425 Gestational diabetes mellitus in childbirth, controlled by oral hypoglycemic drugs: Secondary | ICD-10-CM | POA: Diagnosis present

## 2015-06-12 DIAGNOSIS — O480 Post-term pregnancy: Principal | ICD-10-CM | POA: Diagnosis present

## 2015-06-12 DIAGNOSIS — O322XX0 Maternal care for transverse and oblique lie, not applicable or unspecified: Secondary | ICD-10-CM | POA: Diagnosis present

## 2015-06-12 DIAGNOSIS — Z3A40 40 weeks gestation of pregnancy: Secondary | ICD-10-CM

## 2015-06-12 DIAGNOSIS — Z8249 Family history of ischemic heart disease and other diseases of the circulatory system: Secondary | ICD-10-CM

## 2015-06-12 DIAGNOSIS — O09529 Supervision of elderly multigravida, unspecified trimester: Secondary | ICD-10-CM

## 2015-06-12 DIAGNOSIS — O329XX0 Maternal care for malpresentation of fetus, unspecified, not applicable or unspecified: Secondary | ICD-10-CM

## 2015-06-12 DIAGNOSIS — Z823 Family history of stroke: Secondary | ICD-10-CM | POA: Diagnosis not present

## 2015-06-12 DIAGNOSIS — Z833 Family history of diabetes mellitus: Secondary | ICD-10-CM | POA: Diagnosis not present

## 2015-06-12 DIAGNOSIS — O9962 Diseases of the digestive system complicating childbirth: Secondary | ICD-10-CM | POA: Diagnosis present

## 2015-06-12 DIAGNOSIS — O24419 Gestational diabetes mellitus in pregnancy, unspecified control: Secondary | ICD-10-CM

## 2015-06-12 LAB — CBC
HEMATOCRIT: 34.9 % — AB (ref 36.0–46.0)
Hemoglobin: 11.9 g/dL — ABNORMAL LOW (ref 12.0–15.0)
MCH: 29.7 pg (ref 26.0–34.0)
MCHC: 34.1 g/dL (ref 30.0–36.0)
MCV: 87 fL (ref 78.0–100.0)
PLATELETS: 208 10*3/uL (ref 150–400)
RBC: 4.01 MIL/uL (ref 3.87–5.11)
RDW: 13.4 % (ref 11.5–15.5)
WBC: 11.1 10*3/uL — AB (ref 4.0–10.5)

## 2015-06-12 LAB — TYPE AND SCREEN
ABO/RH(D): O POS
ANTIBODY SCREEN: NEGATIVE

## 2015-06-12 LAB — ABO/RH: ABO/RH(D): O POS

## 2015-06-12 LAB — GLUCOSE, CAPILLARY: Glucose-Capillary: 66 mg/dL (ref 65–99)

## 2015-06-12 MED ORDER — ACETAMINOPHEN 325 MG PO TABS: 650.0000 mg | ORAL_TABLET | ORAL | Status: DC | PRN

## 2015-06-12 MED ORDER — TETANUS-DIPHTH-ACELL PERTUSSIS 5-2.5-18.5 LF-MCG/0.5 IM SUSP: 0.5000 mL | Freq: Once | INTRAMUSCULAR | Status: DC

## 2015-06-12 MED ORDER — LIDOCAINE HCL (PF) 1 % IJ SOLN
30.0000 mL | INTRAMUSCULAR | Status: DC | PRN
  Filled 2015-06-12: qty 30

## 2015-06-12 MED ORDER — SENNOSIDES-DOCUSATE SODIUM 8.6-50 MG PO TABS
2.0000 | ORAL_TABLET | ORAL | Status: DC
  Administered 2015-06-13 (×2): 2 via ORAL
  Filled 2015-06-12 (×2): qty 2

## 2015-06-12 MED ORDER — OXYTOCIN 10 UNIT/ML IJ SOLN
2.5000 [IU]/h | Freq: Once | INTRAVENOUS | Status: DC | PRN
  Filled 2015-06-12: qty 4

## 2015-06-12 MED ORDER — MISOPROSTOL 200 MCG PO TABS
ORAL_TABLET | ORAL | Status: DC
Start: 2015-06-12 — End: 2015-06-13
  Filled 2015-06-12: qty 5

## 2015-06-12 MED ORDER — METHYLERGONOVINE MALEATE 0.2 MG PO TABS
0.2000 mg | ORAL_TABLET | Freq: Four times a day (QID) | ORAL | Status: AC
  Administered 2015-06-12 – 2015-06-13 (×4): 0.2 mg via ORAL
  Filled 2015-06-12 (×5): qty 1

## 2015-06-12 MED ORDER — ONDANSETRON HCL 4 MG PO TABS: 4.0000 mg | ORAL_TABLET | ORAL | Status: DC | PRN

## 2015-06-12 MED ORDER — IBUPROFEN 600 MG PO TABS
600.0000 mg | ORAL_TABLET | Freq: Four times a day (QID) | ORAL | Status: DC
  Administered 2015-06-12 – 2015-06-14 (×8): 600 mg via ORAL
  Filled 2015-06-12 (×8): qty 1

## 2015-06-12 MED ORDER — SIMETHICONE 80 MG PO CHEW: 80.0000 mg | CHEWABLE_TABLET | ORAL | Status: DC | PRN

## 2015-06-12 MED ORDER — LACTATED RINGERS IV SOLN: INTRAVENOUS | Status: DC

## 2015-06-12 MED ORDER — OXYTOCIN BOLUS FROM INFUSION
500.0000 mL | Freq: Once | INTRAVENOUS | Status: AC | PRN
  Administered 2015-06-12: 500 mL via INTRAVENOUS

## 2015-06-12 MED ORDER — ONDANSETRON HCL 4 MG/2ML IJ SOLN: 4.0000 mg | INTRAMUSCULAR | Status: DC | PRN

## 2015-06-12 MED ORDER — TERBUTALINE SULFATE 1 MG/ML IJ SOLN
0.2500 mg | Freq: Once | INTRAMUSCULAR | Status: DC | PRN
  Filled 2015-06-12: qty 1

## 2015-06-12 MED ORDER — OXYCODONE-ACETAMINOPHEN 5-325 MG PO TABS
1.0000 | ORAL_TABLET | Freq: Four times a day (QID) | ORAL | Status: DC | PRN
  Administered 2015-06-12 – 2015-06-13 (×4): 1 via ORAL
  Filled 2015-06-12 (×4): qty 1

## 2015-06-12 MED ORDER — BENZOCAINE-MENTHOL 20-0.5 % EX AERO
1.0000 "application " | INHALATION_SPRAY | CUTANEOUS | Status: DC | PRN
  Administered 2015-06-13: 1 via TOPICAL
  Filled 2015-06-12: qty 56

## 2015-06-12 MED ORDER — ONDANSETRON HCL 4 MG/2ML IJ SOLN: 4.0000 mg | Freq: Four times a day (QID) | INTRAMUSCULAR | Status: DC | PRN

## 2015-06-12 MED ORDER — LACTATED RINGERS IV SOLN: 500.0000 mL | INTRAVENOUS | Status: DC | PRN

## 2015-06-12 MED ORDER — ZOLPIDEM TARTRATE 5 MG PO TABS: 5.0000 mg | ORAL_TABLET | Freq: Every evening | ORAL | Status: DC | PRN

## 2015-06-12 MED ORDER — DIPHENHYDRAMINE HCL 25 MG PO CAPS: 25.0000 mg | ORAL_CAPSULE | Freq: Four times a day (QID) | ORAL | Status: DC | PRN

## 2015-06-12 MED ORDER — LANOLIN HYDROUS EX OINT: TOPICAL_OINTMENT | CUTANEOUS | Status: DC | PRN

## 2015-06-12 MED ORDER — CITRIC ACID-SODIUM CITRATE 334-500 MG/5ML PO SOLN: 30.0000 mL | ORAL | Status: DC | PRN

## 2015-06-12 MED ORDER — PRENATAL MULTIVITAMIN CH
1.0000 | ORAL_TABLET | Freq: Every day | ORAL | Status: DC
  Administered 2015-06-13 – 2015-06-14 (×2): 1 via ORAL
  Filled 2015-06-12 (×2): qty 1

## 2015-06-12 MED ORDER — WITCH HAZEL-GLYCERIN EX PADS: 1.0000 "application " | MEDICATED_PAD | CUTANEOUS | Status: DC | PRN

## 2015-06-12 MED ORDER — OXYTOCIN 10 UNIT/ML IJ SOLN
1.0000 m[IU]/min | INTRAVENOUS | Status: DC
  Administered 2015-06-12: 1 m[IU]/min via INTRAVENOUS

## 2015-06-12 MED ORDER — DIBUCAINE 1 % RE OINT: 1.0000 "application " | TOPICAL_OINTMENT | RECTAL | Status: DC | PRN

## 2015-06-12 NOTE — Progress Notes (Signed)
Pt admitted for IOL for GDM on PO meds and hx of fast labors. Pt doing private cord blood banking, Labs drawn that are required for CBC and routine blood work.

## 2015-06-12 NOTE — H&P (Signed)
Margaret Clay is a 40 y.o. female, G8 P4125 at 40.2 weeks  Patient Active Problem List   Diagnosis Date Noted  . Labor and delivery, indication for care 06/12/2015  . Vaginal delivery 12/27/2012  . AMA (advanced maternal age) multigravida 35+ 06/09/2012  . Preterm delivery 06/09/2012  . Cervical tear resulting from childbirth 06/09/2012  . Kidney stone 06/09/2012  . Hx of PTL (preterm labor) with 3rd pregnancy, term delivery 06/09/2012  . Migraines during pregnancy 06/09/2012  . Rapid first stage of labor 06/09/2012  . Hx fractured coccyx with 3rd baby 06/09/2012  . Missed abortion 2013 09/29/2011    Pregnancy Course: Patient entered care at 10.3 weeks.   EDC of 06/10/15 was established by [place].      [place] evaluations:   19.3 weeks - Anatomy:EFW 288g, linear growth, cervix 4.41cm, female gender and appears normal. NEED CARDIAC VIEWS.     23.3 weeks - FU: Cardiac and placenta cord insertion seen. EFW 578g, linear growth. Cervix 4.32cm.  28.5 weeks - FU:  EFW 2+15, 62%ile, AFI 14.86, transverse lie, posterior placenta, cervix 4.27  32.5 weeks - FU: BPP--8/8, AFI 21.4, WNL. VTX, WITH CORD NOTED NEAR THE INTERNAL OS. EFW 5+3, 81%ILE, WITH BPD, HC, AC ALL S>D, FL S=D  35.5 weeks FU: Single gestation, vertex, normal fluid, cervix equals 3.73 cm, BPP equals 8 out of 8  36.5 weeks FU:EFW 7lbs 3oz 82%, nl fluid , vtx, post placenta, BPP 8/8  37.5 weeks FU: vertex presentation, anterior placenta, fluid normal AFI 50th%tile   38.5 week - FU: FW 7+11, 65%ILE, AFI 18.75, VTX  Significant prenatal events:   AMA   Last evaluation:   39.3 weeks   VE:1/50/-3  Reason for admission:  IOL PD  Pt States:   Contractions Frequency: none         Contraction severity: n/a         Fetal activity: +FM  OB History    Gravida Para Term Preterm AB TAB SAB Ectopic Multiple Living   Obstetric Comments   1ST BABY IN NICU 36 DAYS     Past Medical History  Diagnosis Date  . Preterm  labor 2000; 2005  . Infection     OCC YEAST  . Anemia 2003     DUE TO PP HEMORRHAGE  . Chronic kidney disease 2005    KIDNEY STONES  . Headache(784.0)     FREQUENT  . Recurrent upper respiratory infection (URI)     HX Q 3-4 WEEKS; SEASONAL  . GERD (gastroesophageal reflux disease)     REFLUX DURING PREG  . Ovarian cyst     RECURRENT  . Low iron     Post delivery  . History of kidney stones   . History of chicken pox   . Missed abortion 09/29/2011    cytotec induction 09/24/11;  Quant & CBC 09/29/11 outpatient at [facility]  . Hx of maternal cervical laceration, currently pregnant   . Gestational diabetes 2011    glyburide   Past Surgical History  Procedure Laterality Date  . Tonsillectomy      AGE 31  . Adenoidectomy      AGE 31  . Wisdom tooth extraction      AGE 63  . Cervical repair  2003   Family History: family history includes Arthritis in her maternal grandmother and mother; Cancer in her maternal grandmother, paternal grandfather, and paternal  grandmother; Diabetes in her maternal grandmother; Heart disease in her paternal grandfather; Hypertension in her father; Kidney disease in her brother and maternal grandfather; Other in her maternal grandmother; Stroke in her father, maternal grandmother, mother, and paternal grandfather. Social History:  reports that she has never smoked. She has never used smokeless tobacco. She reports that she does not drink alcohol or use illicit drugs.   Prenatal Transfer Tool  Maternal Diabetes: Yes:  Diabetes Type:  Insulin/Medication controlled Genetic Screening: Declined Maternal Ultrasounds/Referrals: Normal Fetal Ultrasounds or other Referrals:  None Maternal Substance Abuse:  No Significant Maternal Medications:  Meds include: Other: Metformin Significant Maternal Lab Results: Lab values include: Group B Strep negative   ROS:  See HPI above, all other systems are negative  No Known Allergies  Exam by:: V. S. CNM  Blood pressure  123/71, pulse 98, temperature 98.5 F (36.9 C), resp. rate 18, height  (1.448 m), weight 159 lb (72.122 kg), currently breastfeeding.  Maternal Exam:  Uterine Assessment: Contraction frequency is rare.  Abdomen: Gravid, non tender. Fundal height is aga.  Normal external genitalia, vulva, cervix, uterus and adnexa.  No lesions noted on exam.  Pelvis adequate for delivery.  Fetal presentation: oblique cephalic, maternal left  Fetal Exam:  Monitor Surveillance : Continuous Monitoring-  Mode: Ultrasound.  NICHD: Category 1 CTXs: none EFW   7.5 lbs  Physical Exam: Nursing note and vitals reviewed General: alert and cooperative She appears well nourished Psychiatric: Normal mood and affect. Her behavior is normal Head: Normocephalic Eyes: Pupils are equal, round, and reactive to light Neck: Normal range of motion Cardiovascular: RRR without murmur  Respiratory: CTAB. Effort normal  Abd: soft, non-tender, +BS, no rebound, no guarding  Genitourinary: Vagina normal  Neurological: A&Ox3 Skin: Warm and dry  Musculoskeletal: Normal range of motion  Homan's sign negative bilaterally No evidence of DVTs.  Edema: Minimal bilaterally non-pitting edema DTR: 2+ Clonus: None   Prenatal labs: ABO, Rh: --/--/O POS (02/19 0800) Antibody: NEG (02/19 0800) Rubella:  immune RPR: Nonreactive (01/11 0000)  HBsAg: Negative (01/11 0000)  HIV: Non-reactive (01/11 0000)  GBS: Negative (01/25 0000) Sickle cell/Hgb electrophoresis:  WNL Pap:  Normal 11/15/14 GC:    neg Chlamydia: neg Genetic screenings:   Glucola:  Failed  GDM  Assessment:  IUP at 40.2 weeks NICHD: Category1 Membranes: intact Bishop Score: 11 GBS negative Diagnosis: IUP term IOL options reviewed with patient including foley bulb, AROM, and pitocin R&B of IOL reviewed including serial induction, failure, and/or CS requirement Pain management options reviewed Pt and family verbalize understanding, agrees with  treatment plan  and wishes to proceed with induction process   Plan:  Admit to L&D for IOL d/tpost dates Start induction with foley bulb Regular diet prior to starting pitocin Clear/Thin diet after pitocin starts Will reassess at 1830  Continue with labor mgmt as ordered IV pain medication per orders PRN Epidural per patient request Foley cath after patient is comfortable with epidural Anticipate SVD  Attending MD available at all times.   Bethany Hirt, CNM, MSN 06/12/2015, 9:45 AM

## 2015-06-12 NOTE — Progress Notes (Addendum)
Labor Progress  Subjective: Starting to feel ctx.  Pt looking up on the be  Objective: BP 108/70 mmHg  Pulse 88  Temp(Src) 98.4 F (36.9 C) (Oral)  Resp 18  Ht  (1.448 m)  Wt 159 lb (72.122 kg)  BMI 34.40 kg/m2     FHT: 135, moderate variability, + accel, no decel CTX:  irregular, every 3-6 minutes Uterus gravid, soft non tender SVE:  Dilation: 4 Effacement (%): 60 Station: -3 Exam by:: v standard, cnm  Assessment:  IUP at 40.2 weeks NICHD: Category 1 Membranes: intact Labor progress: IOL  PPI:[id number] Foley bulb placed at 1030 [place] for position - cephalic oblique, maternal left  Plan: Continue labor plan Continuous monitoring Rest Frequent position changes to facilitate fetal rotation and descent. Will reassess with cervical exam at 1600 or earlier if necessary     Venus Standard, CNM, MSN 06/12/2015. 1:50 PM    Addendum 1330 Dr Sallye Ober at the bedside for a successful ECV.  Pt tolerated the procedure well.  FOB at the bedside.  Foley bulb dislodged at 1344.  VE 4/60/-3

## 2015-06-12 NOTE — Procedures (Signed)
  [name], [name] DOB 03-Jan-1976   I discussed with the patient risks, benefits and alternatives of external cephalic version for oblique lie including risks to the baby including abnormal fetal heart tracing, placenta abruption, damage to maternal organs, need for emergent cesarean delivery.  All questions were answered and consent was signed.    Procedure was done with assistance from [facility] Standard CNM.  Ultrasound done showed oblique lie, head to maternal left, fetal body extending to mother's right. Pressure was applied over fetus buttock area in clockwise direction while at the same time pressure was applied to fetal head towards mom's pubic bone area in clockwise manner.  Fetal heart beat check was normal in between maneuvers.  Ultrasound done at end of maneuvers showed cephalic presentation, longitudinal lie, not oblique any more.  Mother and baby tolerated the procedure without any complications. Dr. Sallye Ober.

## 2015-06-12 NOTE — Progress Notes (Signed)
Pt her for IOL for post dates.  VE 1-2/50/-3.  Uable to determine vertex.  BS US shows oblique.  Pt sent to [place] for official scan

## 2015-06-12 NOTE — Consults (Signed)
  Anesthesia Pain Consult Note  Patient: Waynette Buttery, 40 y.o., female  Consult Requested by: Hoover Browns, MD  Reason for Consult: CRNA Pain Management Rounds  Level of Consciousness: alert  Pain: 0 /10 Pain Goal 10  Last Vitals:  Filed Vitals:   06/12/15 0743  BP: 123/71  Pulse: 98  Resp: 18    Plan: Natural childbirth.  Merion Caton 06/12/2015

## 2015-06-12 NOTE — Progress Notes (Signed)
Sabas Sous notified that on fundal check uterus firm after massage with moderate bleeding.  Orders received

## 2015-06-12 NOTE — H&P (Signed)
Labor Progress  Subjective: Managing each painful ctx with breathing and back rubs from FOB.  Discussed AROM but she wants to wait.   Objective: BP 110/58 mmHg  Pulse 77  Temp(Src) 98.6 F (37 C) (Oral)  Resp 18  Ht  (1.448 m)  Wt 159 lb (72.122 kg)  BMI 34.40 kg/m2     FHT: 135, moderate variability, + accel, no decel CTX:  regular, every 2-3 minutes Uterus gravid, soft non tender SVE:  Dilation: 5 Effacement (%): 80 Station: -2 Exam by:: v Jaquann Guarisco Pitocin at 44mUn/min  Assessment:  IUP at 40.2 weeks NICHD: Category 1 Membranes:  BBW Labor progress: IOL Pitocin Augmentation  Plan: Continue labor plan Continuous monitoring Frequent position changes to facilitate fetal rotation and descent. Continue pitocin per protocol      Shevette Bess, CNM, MSN 06/12/2015. 6:15 PM

## 2015-06-13 LAB — CBC
HEMATOCRIT: 32.6 % — AB (ref 36.0–46.0)
HEMOGLOBIN: 11.2 g/dL — AB (ref 12.0–15.0)
MCH: 29.9 pg (ref 26.0–34.0)
MCHC: 34.4 g/dL (ref 30.0–36.0)
MCV: 86.9 fL (ref 78.0–100.0)
Platelets: 189 10*3/uL (ref 150–400)
RBC: 3.75 MIL/uL — ABNORMAL LOW (ref 3.87–5.11)
RDW: 13.5 % (ref 11.5–15.5)
WBC: 16.9 10*3/uL — ABNORMAL HIGH (ref 4.0–10.5)

## 2015-06-13 LAB — RPR: RPR: NONREACTIVE

## 2015-06-13 LAB — GLUCOSE, CAPILLARY: GLUCOSE-CAPILLARY: 134 mg/dL — AB (ref 65–99)

## 2015-06-13 MED ORDER — OXYCODONE-ACETAMINOPHEN 5-325 MG PO TABS
1.0000 | ORAL_TABLET | Freq: Once | ORAL | Status: AC
  Administered 2015-06-13: 1 via ORAL
  Filled 2015-06-13: qty 1

## 2015-06-13 MED ORDER — CYCLOBENZAPRINE HCL 5 MG PO TABS
5.0000 mg | ORAL_TABLET | Freq: Three times a day (TID) | ORAL | Status: DC | PRN
  Administered 2015-06-13: 5 mg via ORAL
  Filled 2015-06-13 (×2): qty 1

## 2015-06-13 MED ORDER — CALCIUM CARBONATE ANTACID 500 MG PO CHEW
200.0000 mg | CHEWABLE_TABLET | Freq: Three times a day (TID) | ORAL | Status: DC | PRN
  Administered 2015-06-13 (×2): 200 mg via ORAL
  Filled 2015-06-13 (×2): qty 1

## 2015-06-13 NOTE — Lactation Note (Signed)
This note was copied from a baby's chart. Lactation Consultation Note Experienced BF mom of 5 other children whom she BF from 1 to 59 months old. States she has had fundal infections d/t baby in NICU on ABT. And had bouts of mastitis. Has personal DEBP. Mom hand expressed colostrum during L&D. 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 Encouraged to request LC if needed or has questions. WH/LC brochure given w/resources, support groups and LC services. Patient Name: Margaret Clay Date: 06/13/2015 Reason for consult: Initial assessment   Maternal Data Has patient been taught Hand Expression?: Yes Does the patient have breastfeeding experience prior to this delivery?: Yes  Feeding Feeding Type: Breast Fed Length of feed: 25 min  LATCH Score/Interventions Latch: Grasps breast easily, tongue down, lips flanged, rhythmical sucking.  Audible Swallowing: Spontaneous and intermittent  Type of Nipple: Everted at rest and after stimulation  Comfort (Breast/Nipple): Soft / non-tender     Hold (Positioning): No assistance needed to correctly position infant at breast.  LATCH Score: 10  Lactation Tools Discussed/Used     Consult Status Consult Status: PRN Date: 06/14/15 Follow-up type: In-patient    Margaret Clay, Diamond Nickel 06/13/2015, 5:50 AM

## 2015-06-13 NOTE — Lactation Note (Signed)
This note was copied from a baby's chart. Lactation Consultation Note Having a lot of back pain from delivery. No issues BF baby.  Patient Name: Margaret Clay [id number] Date: 06/13/2015 Reason for consult: Initial assessment   Maternal Data Has patient been taught Hand Expression?: Yes Does the patient have breastfeeding experience prior to this delivery?: Yes  Feeding    LATCH Score/Interventions                      Lactation Tools Discussed/Used     Consult Status Consult Status: PRN Date: 06/14/15 Follow-up type: In-patient    Lonnie Reth, Diamond Nickel 06/13/2015, 6:03 AM

## 2015-06-13 NOTE — Progress Notes (Signed)
Subjective: Postpartum Day 1: Vaginal delivery, No laceration Patient up ad lib, reports no syncope or dizziness. Reports back pain and cramping Feeding:  Breast Contraceptive plan: undecided- does not desire hormonal contraception  Objective: Vital signs in last 24 hours: Temp:  [97.8 F (36.6 C)-98.7 F (37.1 C)] 98.4 F (36.9 C) (02/20 1825) Pulse Rate:  [73-103] 87 (02/20 1825) Resp:  [18-24] 18 (02/20 1825) BP: (101-121)/(64-88) 112/68 mmHg (02/20 1825) SpO2:  [99 %] 99 % (02/20 0143)  Physical Exam:  General: alert and cooperative Lochia: appropriate Uterine Fundus: firm Perineum: healing well DVT Evaluation: No evidence of DVT seen on physical exam. Negative Homan's sign.   CBC Latest Ref Rng 06/13/2015 06/12/2015 12/28/2012  WBC 4.0 - 10.5 K/uL 16.9(H) 11.1(H) 15.3(H)  Hemoglobin 12.0 - 15.0 g/dL 11.2(L) 11.9(L) 11.1(L)  Hematocrit 36.0 - 46.0 % 32.6(L) 34.9(L) 32.5(L)  Platelets 150 - 400 K/uL 189 208 173     Assessment/Plan: Status post vaginal delivery day 1. Stable Completing 24 hr methergine course Breastfeeding Undecided contraception Continue current care. Plan for discharge tomorrow    Beatrix Fetters 06/13/2015, 6:48 PM

## 2015-06-14 LAB — GLUCOSE, CAPILLARY
Glucose-Capillary: 150 mg/dL — ABNORMAL HIGH (ref 65–99)
Glucose-Capillary: 82 mg/dL (ref 65–99)

## 2015-06-14 LAB — GLUCOSE, RANDOM: GLUCOSE: 95 mg/dL (ref 65–99)

## 2015-06-14 MED ORDER — OXYCODONE-ACETAMINOPHEN 5-325 MG PO TABS: 1.0000 | ORAL_TABLET | Freq: Four times a day (QID) | ORAL | Status: DC | PRN

## 2015-06-14 MED ORDER — IBUPROFEN 600 MG PO TABS: 600.0000 mg | ORAL_TABLET | Freq: Four times a day (QID) | ORAL | Status: DC | PRN

## 2015-06-14 NOTE — Discharge Instructions (Signed)
Postpartum Care After Vaginal Delivery °After you deliver your newborn (postpartum period), the usual stay in the hospital is 24-72 hours. If there were problems with your labor or delivery, or if you have other medical problems, you might be in the hospital longer.  °While you are in the hospital, you will receive help and instructions on how to care for yourself and your newborn during the postpartum period.  °While you are in the hospital: °· Be sure to tell your nurses if you have pain or discomfort, as well as where you feel the pain and what makes the pain worse. °· If you had an incision made near your vagina (episiotomy) or if you had some tearing during delivery, the nurses may put ice packs on your episiotomy or tear. The ice packs may help to reduce the pain and swelling. °· If you are breastfeeding, you may feel uncomfortable contractions of your uterus for a couple of weeks. This is normal. The contractions help your uterus get back to normal size. °· It is normal to have some bleeding after delivery. °· For the first 1-3 days after delivery, the flow is red and the amount may be similar to a period. °· It is common for the flow to start and stop. °· In the first few days, you may pass some small clots. Let your nurses know if you begin to pass large clots or your flow increases. °· Do not  flush blood clots down the toilet before having the nurse look at them. °· During the next 3-10 days after delivery, your flow should become more watery and pink or brown-tinged in color. °· Ten to fourteen days after delivery, your flow should be a small amount of yellowish-white discharge. °· The amount of your flow will decrease over the first few weeks after delivery. Your flow may stop in 6-8 weeks. Most women have had their flow stop by 12 weeks after delivery. °· You should change your sanitary pads frequently. °· Wash your hands thoroughly with soap and water for at least 20 seconds after changing pads, using  the toilet, or before holding or feeding your newborn. °· You should feel like you need to empty your bladder within the first 6-8 hours after delivery. °· In case you become weak, lightheaded, or faint, call your nurse before you get out of bed for the first time and before you take a shower for the first time. °· Within the first few days after delivery, your breasts may begin to feel tender and full. This is called engorgement. Breast tenderness usually goes away within 48-72 hours after engorgement occurs. You may also notice milk leaking from your breasts. If you are not breastfeeding, do not stimulate your breasts. Breast stimulation can make your breasts produce more milk. °· Spending as much time as possible with your newborn is very important. During this time, you and your newborn can feel close and get to know each other. Having your newborn stay in your room (rooming in) will help to strengthen the bond with your newborn.  It will give you time to get to know your newborn and become comfortable caring for your newborn. °· Your hormones change after delivery. Sometimes the hormone changes can temporarily cause you to feel sad or tearful. These feelings should not last more than a few days. If these feelings last longer than that, you should talk to your caregiver. °· If desired, talk to your caregiver about methods of family planning or contraception. °·   Talk to your caregiver about immunizations. Your caregiver may want you to have the following immunizations before leaving the hospital:  Tetanus, diphtheria, and pertussis (Tdap) or tetanus and diphtheria (Td) immunization. It is very important that you and your family (including grandparents) or others caring for your newborn are up-to-date with the Tdap or Td immunizations. The Tdap or Td immunization can help protect your newborn from getting ill.  Rubella immunization.  Varicella (chickenpox) immunization.  Influenza immunization. You should  receive this annual immunization if you did not receive the immunization during your pregnancy.   This information is not intended to replace advice given to you by your health care provider. Make sure you discuss any questions you have with your health care provider.   Document Released: 02/04/2007 Document Revised: 01/02/2012 Document Reviewed: 12/05/2011 Elsevier Interactive Patient Education [employer]. Breastfeeding Deciding to breastfeed is one of the best choices you can make for you and your baby. A change in hormones during pregnancy causes your breast tissue to grow and increases the number and size of your milk ducts. These hormones also allow proteins, sugars, and fats from your blood supply to make breast milk in your milk-producing glands. Hormones prevent breast milk from being released before your baby is born as well as prompt milk flow after birth. Once breastfeeding has begun, thoughts of your baby, as well as his or her sucking or crying, can stimulate the release of milk from your milk-producing glands.  BENEFITS OF BREASTFEEDING For Your Baby  Your first milk (colostrum) helps your baby's digestive system function better.  There are antibodies in your milk that help your baby fight off infections.  Your baby has a lower incidence of asthma, allergies, and sudden infant death syndrome.  The nutrients in breast milk are better for your baby than infant formulas and are designed uniquely for your baby's needs.  Breast milk improves your baby's brain development.  Your baby is less likely to develop other conditions, such as childhood obesity, asthma, or type 2 diabetes mellitus. For You  Breastfeeding helps to create a very special bond between you and your baby.  Breastfeeding is convenient. Breast milk is always available at the correct temperature and costs nothing.  Breastfeeding helps to burn calories and helps you lose the weight gained during  pregnancy.  Breastfeeding makes your uterus contract to its prepregnancy size faster and slows bleeding (lochia) after you give birth.   Breastfeeding helps to lower your risk of developing type 2 diabetes mellitus, osteoporosis, and breast or ovarian cancer later in life. SIGNS THAT YOUR BABY IS HUNGRY Early Signs of Hunger  Increased alertness or activity.  Stretching.  Movement of the head from side to side.  Movement of the head and opening of the mouth when the corner of the mouth or cheek is stroked (rooting).  Increased sucking sounds, smacking lips, cooing, sighing, or squeaking.  Hand-to-mouth movements.  Increased sucking of fingers or hands. Late Signs of Hunger  Fussing.  Intermittent crying. Extreme Signs of Hunger Signs of extreme hunger will require calming and consoling before your baby will be able to breastfeed successfully. Do not wait for the following signs of extreme hunger to occur before you initiate breastfeeding:  Restlessness.  A loud, strong cry.  Screaming. BREASTFEEDING BASICS Breastfeeding Initiation  Find a comfortable place to sit or lie down, with your neck and back well supported.  Place a pillow or rolled up blanket under your baby to bring him or  her to the level of your breast (if you are seated). Nursing pillows are specially designed to help support your arms and your baby while you breastfeed.  Make sure that your baby's abdomen is facing your abdomen.  Gently massage your breast. With your fingertips, massage from your chest wall toward your nipple in a circular motion. This encourages milk flow. You may need to continue this action during the feeding if your milk flows slowly.  Support your breast with 4 fingers underneath and your thumb above your nipple. Make sure your fingers are well away from your nipple and your baby's mouth.  Stroke your baby's lips gently with your finger or nipple.  When your baby's mouth is open  wide enough, quickly bring your baby to your breast, placing your entire nipple and as much of the colored area around your nipple (areola) as possible into your baby's mouth.  More areola should be visible above your baby's upper lip than below the lower lip.  Your baby's tongue should be between his or her lower gum and your breast.  Ensure that your baby's mouth is correctly positioned around your nipple (latched). Your baby's lips should create a seal on your breast and be turned out (everted).  It is common for your baby to suck about 2-3 minutes in order to start the flow of breast milk. Latching Teaching your baby how to latch on to your breast properly is very important. An improper latch can cause nipple pain and decreased milk supply for you and poor weight gain in your baby. Also, if your baby is not latched onto your nipple properly, he or she may swallow some air during feeding. This can make your baby fussy. Burping your baby when you switch breasts during the feeding can help to get rid of the air. However, teaching your baby to latch on properly is still the best way to prevent fussiness from swallowing air while breastfeeding. Signs that your baby has successfully latched on to your nipple:  Silent tugging or silent sucking, without causing you pain.  Swallowing heard between every 3-4 sucks.  Muscle movement above and in front of his or her ears while sucking. Signs that your baby has not successfully latched on to nipple:  Sucking sounds or smacking sounds from your baby while breastfeeding.  Nipple pain. If you think your baby has not latched on correctly, slip your finger into the corner of your baby's mouth to break the suction and place it between your baby's gums. Attempt breastfeeding initiation again. Signs of Successful Breastfeeding Signs from your baby:  A gradual decrease in the number of sucks or complete cessation of sucking.  Falling asleep.  Relaxation  of his or her body.  Retention of a small amount of milk in his or her mouth.  Letting go of your breast by himself or herself. Signs from you:  Breasts that have increased in firmness, weight, and size 1-3 hours after feeding.  Breasts that are softer immediately after breastfeeding.  Increased milk volume, as well as a change in milk consistency and color by the fifth day of breastfeeding.  Nipples that are not sore, cracked, or bleeding. Signs That Your [employer] is Getting Enough Milk  Wetting at least 3 diapers in a 24-hour period. The urine should be clear and pale yellow by age 35 days.  At least 3 stools in a 24-hour period by age 35 days. The stool should be soft and yellow.  At least 3  stools in a 24-hour period by age 39 days. The stool should be seedy and yellow.  No loss of weight greater than 10% of birth weight during the first 43 days of age.  Average weight gain of 4-7 ounces (113-198 g) per week after age 32 days.  Consistent daily weight gain by age 68 days, without weight loss after the age of 2 weeks. After a feeding, your baby may spit up a small amount. This is common. BREASTFEEDING FREQUENCY AND DURATION Frequent feeding will help you make more milk and can prevent sore nipples and breast engorgement. Breastfeed when you feel the need to reduce the fullness of your breasts or when your baby shows signs of hunger. This is called "breastfeeding on demand." Avoid introducing a pacifier to your baby while you are working to establish breastfeeding (the first 4-6 weeks after your baby is born). After this time you may choose to use a pacifier. Research has shown that pacifier use during the first year of a baby's life decreases the risk of sudden infant death syndrome (SIDS). Allow your baby to feed on each breast as long as he or she wants. Breastfeed until your baby is finished feeding. When your baby unlatches or falls asleep while feeding from the first breast, offer the  second breast. Because newborns are often sleepy in the first few weeks of life, you may need to awaken your baby to get him or her to feed. Breastfeeding times will vary from baby to baby. However, the following rules can serve as a guide to help you ensure that your baby is properly fed:  Newborns (babies 85 weeks of age or younger) may breastfeed every 1-3 hours.  Newborns should not go longer than 3 hours during the day or 5 hours during the night without breastfeeding.  You should breastfeed your baby a minimum of 8 times in a 24-hour period until you begin to introduce solid foods to your baby at around 59 months of age. BREAST MILK PUMPING Pumping and storing breast milk allows you to ensure that your baby is exclusively fed your breast milk, even at times when you are unable to breastfeed. This is especially important if you are going back to work while you are still breastfeeding or when you are not able to be present during feedings. Your lactation consultant can give you guidelines on how long it is safe to store breast milk. A breast pump is a machine that allows you to pump milk from your breast into a sterile bottle. The pumped breast milk can then be stored in a refrigerator or freezer. Some breast pumps are operated by hand, while others use electricity. Ask your lactation consultant which type will work best for you. Breast pumps can be purchased, but some hospitals and breastfeeding support groups lease breast pumps on a monthly basis. A lactation consultant can teach you how to hand express breast milk, if you prefer not to use a pump. CARING FOR YOUR BREASTS WHILE YOU BREASTFEED Nipples can become dry, cracked, and sore while breastfeeding. The following recommendations can help keep your breasts moisturized and healthy:  Avoid using soap on your nipples.  Wear a supportive bra. Although not required, special nursing bras and tank tops are designed to allow access to your breasts  for breastfeeding without taking off your entire bra or top. Avoid wearing underwire-style bras or extremely tight bras.  Air dry your nipples for 3-58mnutes after each feeding.  Use only cotton bra pads  to absorb leaked breast milk. Leaking of breast milk between feedings is normal.  Use lanolin on your nipples after breastfeeding. Lanolin helps to maintain your skin's normal moisture barrier. If you use pure lanolin, you do not need to wash it off before feeding your baby again. Pure lanolin is not toxic to your baby. You may also hand express a few drops of breast milk and gently massage that milk into your nipples and allow the milk to air dry. In the first few weeks after giving birth, some women experience extremely full breasts (engorgement). Engorgement can make your breasts feel heavy, warm, and tender to the touch. Engorgement peaks within 3-5 days after you give birth. The following recommendations can help ease engorgement:  Completely empty your breasts while breastfeeding or pumping. You may want to start by applying warm, moist heat (in the shower or with warm water-soaked hand towels) just before feeding or pumping. This increases circulation and helps the milk flow. If your baby does not completely empty your breasts while breastfeeding, pump any extra milk after he or she is finished.  Wear a snug bra (nursing or regular) or tank top for 1-2 days to signal your body to slightly decrease milk production.  Apply ice packs to your breasts, unless this is too uncomfortable for you.  Make sure that your baby is latched on and positioned properly while breastfeeding. If engorgement persists after 48 hours of following these recommendations, contact your health care provider or a Science writer. OVERALL HEALTH CARE RECOMMENDATIONS WHILE BREASTFEEDING  Eat healthy foods. Alternate between meals and snacks, eating 3 of each per day. Because what you eat affects your breast milk,  some of the foods may make your baby more irritable than usual. Avoid eating these foods if you are sure that they are negatively affecting your baby.  Drink milk, fruit juice, and water to satisfy your thirst (about 10 glasses a day).  Rest often, relax, and continue to take your prenatal vitamins to prevent fatigue, stress, and anemia.  Continue breast self-awareness checks.  Avoid chewing and smoking tobacco. Chemicals from cigarettes that pass into breast milk and exposure to secondhand smoke may harm your baby.  Avoid alcohol and drug use, including marijuana. Some medicines that may be harmful to your baby can pass through breast milk. It is important to ask your health care provider before taking any medicine, including all over-the-counter and prescription medicine as well as vitamin and herbal supplements. It is possible to become pregnant while breastfeeding. If birth control is desired, ask your health care provider about options that will be safe for your baby. SEEK MEDICAL CARE IF:  You feel like you want to stop breastfeeding or have become frustrated with breastfeeding.  You have painful breasts or nipples.  Your nipples are cracked or bleeding.  Your breasts are red, tender, or warm.  You have a swollen area on either breast.  You have a fever or chills.  You have nausea or vomiting.  You have drainage other than breast milk from your nipples.  Your breasts do not become full before feedings by the fifth day after you give birth.  You feel sad and depressed.  Your baby is too sleepy to eat well.  Your baby is having trouble sleeping.   Your baby is wetting less than 3 diapers in a 24-hour period.  Your baby has less than 3 stools in a 24-hour period.  Your baby's skin or the white part of his  or her eyes becomes yellow.   Your baby is not gaining weight by 10 days of age. SEEK IMMEDIATE MEDICAL CARE IF:  Your baby is overly tired (lethargic) and does  not want to wake up and feed.  Your baby develops an unexplained fever.   This information is not intended to replace advice given to you by your health care provider. Make sure you discuss any questions you have with your health care provider.  Iron-Rich Diet Iron is a mineral that helps your body to produce hemoglobin. Hemoglobin is a protein in your red blood cells that carries oxygen to your body's tissues. Eating too little iron may cause you to feel weak and tired, and it can increase your risk for infection. Eating enough iron is necessary for your body's metabolism, muscle function, and nervous system. Iron is naturally found in many foods. It can also be added to foods or fortified in foods. There are two types of dietary iron:  Heme iron. Heme iron is absorbed by the body more easily than nonheme iron. Heme iron is found in meat, poultry, and fish.  Nonheme iron. Nonheme iron is found in dietary supplements, iron-fortified grains, beans, and vegetables. You may need to follow an iron-rich diet if:  You have been diagnosed with iron deficiency or iron-deficiency anemia.  You have a condition that prevents you from absorbing dietary iron, such as:  Infection in your intestines.  Celiac disease. This involves long-lasting (chronic) inflammation of your intestines.  You do not eat enough iron.  You eat a diet that is high in foods that impair iron absorption.  You have lost a lot of blood.  You have heavy bleeding during your menstrual cycle.  You are pregnant. WHAT IS MY PLAN? Your health care provider may help you to determine how much iron you need per day based on your condition. Generally, when a person consumes sufficient amounts of iron in the diet, the following iron needs are met:  Men.  75-11 years old: 11 mg per day.  69-76 years old: 8 mg per day.  Women.   24-44 years old: 15 mg per day.  28-22 years old: 18 mg per day.  Over 65 years old: 8 mg per  day.  Pregnant women: 27 mg per day.  Breastfeeding women: 9 mg per day. WHAT DO I NEED TO KNOW ABOUT AN IRON-RICH DIET?  Eat fresh fruits and vegetables that are high in vitamin C along with foods that are high in iron. This will help increase the amount of iron that your body absorbs from food, especially with foods containing nonheme iron. Foods that are high in vitamin C include oranges, peppers, tomatoes, and mango.  Take iron supplements only as directed by your health care provider. Overdose of iron can be life-threatening. If you were prescribed iron supplements, take them with orange juice or a vitamin C supplement.  Cook foods in pots and pans that are made from iron.   Eat nonheme iron-containing foods alongside foods that are high in heme iron. This helps to improve your iron absorption.   Certain foods and drinks contain compounds that impair iron absorption. Avoid eating these foods in the same meal as iron-rich foods or with iron supplements. These include:  Coffee, black tea, and red wine.  Milk, dairy products, and foods that are high in calcium.  Beans, soybeans, and peas.  Whole grains.  When eating foods that contain both nonheme iron and compounds that impair iron  absorption, follow these tips to absorb iron better.   Soak beans overnight before cooking.  Soak whole grains overnight and drain them before using.  Ferment flours before baking, such as using yeast in bread dough. WHAT FOODS CAN I EAT? Grains Iron-fortified breakfast cereal. Iron-fortified whole-wheat bread. Enriched rice. Sprouted grains. Vegetables Spinach. Potatoes with skin. Green peas. Broccoli. Red and green bell peppers. Fermented vegetables. Fruits Prunes. Raisins. Oranges. Strawberries. Mango. Grapefruit. Meats and Other Protein Sources Beef liver. Oysters. Beef. Shrimp. [place]. Chicken. [place]. Sardines. Chickpeas. Nuts. Tofu. Beverages Tomato juice. Fresh orange juice. Prune  juice. Hibiscus tea. Fortified instant breakfast shakes. Condiments Tahini. Fermented soy sauce. Sweets and Desserts Black-strap molasses.  Other Wheat germ. The items listed above may not be a complete list of recommended foods or beverages. Contact your dietitian for more options. WHAT FOODS ARE NOT RECOMMENDED? Grains Whole grains. Bran cereal. Bran flour. Oats. Vegetables Artichokes. Brussels sprouts. Kale. Fruits Blueberries. Raspberries. Strawberries. Figs. Meats and Other Protein Sources Soybeans. Products made from soy protein. Dairy Milk. Cream. Cheese. Yogurt. Cottage cheese. Beverages Coffee. Black tea. Red wine. Sweets and Desserts Cocoa. Chocolate. Ice cream. Other Basil. Oregano. Parsley. The items listed above may not be a complete list of foods and beverages to avoid. Contact your dietitian for more information.   This information is not intended to replace advice given to you by your health care provider. Make sure you discuss any questions you have with your health care provider.   Document Released: 11/21/2004 Document Revised: 04/30/2014 Document Reviewed: 11/04/2013 Elsevier Interactive Patient Education 2016 [place] Released: 04/09/2005 Document Revised: 12/29/2014 Document Reviewed: 10/01/2012 Elsevier Interactive Patient Education [employer].

## 2015-06-14 NOTE — Discharge Summary (Signed)
OB Discharge Summary     Patient Name: DANNY ZIMNY DOB: 01-19-1976 MRN: [id number]  Date of admission: 06/12/2015 Delivering MD: Gerrit Heck   Date of discharge: 06/14/2015  Admitting diagnosis: 40WKS INDUCTION  Intrauterine pregnancy: [redacted]w[redacted]d     Secondary diagnosis:  Principal Problem:   SVD (spontaneous vaginal delivery) Active Problems:   AMA (advanced maternal age) multigravida 35+   Labor and delivery, indication for care   GDM, class A2  Additional problems: none     Discharge diagnosis: Term Pregnancy Delivered                                                                                                Post partum procedures:Methergine   Augmentation: Pitocin  Complications: None  Hospital course:  Induction of Labor With Vaginal Delivery     40 y.o. yo [id number] at [redacted]w[redacted]d was admitted  For post dates induction on 06/12/2015. Patient had an uncomplicated labor course as follows:  Membrane Rupture Time/Date: 6:54 PM ,06/12/2015   Intrapartum Procedures: Episiotomy: None [1]                                         Lacerations:  None [1]  Patient had a delivery of a Viable infant. 06/12/2015  Information for the patient's newborn:  Tumeka, Chimenti [[id number]]  Delivery Method: Vaginal, Spontaneous Delivery (Filed from Delivery Summary)     Pateint had an uncomplicated postpartum course.  She is ambulating, tolerating a regular diet, passing flatus, and urinating well. Patient is discharged home in stable condition on 06/14/2015.    Physical exam  Filed Vitals:   06/13/15 0143 06/13/15 0920 06/13/15 1825 06/14/15 0725  BP: 110/69 105/66 112/68 115/67  Pulse: 87 73 87 80  Temp: 97.8 F (36.6 C) 97.8 F (36.6 C) 98.4 F (36.9 C) 98 F (36.7 C)  TempSrc: Oral Oral Oral Oral  Resp: Height:      Weight:      SpO2: 99%      General: alert and cooperative Lochia: appropriate Uterine Fundus: firm Incision: Healing well with no significant  drainage DVT Evaluation: No evidence of DVT seen on physical exam. Negative Homan's sign. Labs: Lab Results  Component Value Date   WBC 16.9* 06/13/2015   HGB 11.2* 06/13/2015   HCT 32.6* 06/13/2015   MCV 86.9 06/13/2015   PLT 189 06/13/2015   CMP Latest Ref Rng 06/14/2015  Glucose 65 - 99 mg/dL 95  BUN 6 - 23 mg/dL -  Creatinine 0.4 - 1.2 mg/dL -  Sodium 742 - 595 mEq/L -  Potassium 3.5 - 5.1 mEq/L -  Chloride 96 - 112 mEq/L -  CO2 19 - 32 mEq/L -  Calcium 8.4 - 10.5 mg/dL -  Total Protein 6.0 - 8.3 g/dL -  Total Bilirubin 0.3 - 1.2 mg/dL -  Alkaline Phos 39 - 638 U/L -  AST 0 - 37 U/L -  ALT 0 - 35 U/L -  Discharge instruction: per After Visit Summary and "Baby and Me Booklet".  After visit meds:    Medication List    STOP taking these medications        acetaminophen 500 MG tablet  Commonly known as:  TYLENOL     calcium carbonate 500 MG chewable tablet  Commonly known as:  TUMS - dosed in mg elemental calcium     hydrocortisone 25 MG suppository  Commonly known as:  ANUSOL-HC     metFORMIN 500 MG tablet  Commonly known as:  GLUCOPHAGE     norethindrone 0.35 MG tablet  Commonly known as:  ORTHO MICRONOR      TAKE these medications        ibuprofen 600 MG tablet  Commonly known as:  ADVIL,MOTRIN  Take 1 tablet (600 mg total) by mouth every 6 (six) hours as needed.     oxyCODONE-acetaminophen 5-325 MG tablet  Commonly known as:  PERCOCET/ROXICET  Take 1 tablet by mouth every 6 (six) hours as needed for moderate pain.     prenatal multivitamin Tabs tablet  Take 1 tablet by mouth daily at 12 noon.        Diet: carb modified diet  Activity: Advance as tolerated. Pelvic rest for 6 weeks.   Outpatient follow up:6 weeks Follow up Appt:No future appointments. Follow up Visit:No Follow-up on file.  Postpartum contraception: None - does not want to talk about contracetion at this time, states she is exclusively breastfeeding and has no need, will  discuss at 6 wk visit.   Newborn Data: Live born female  Birth Weight: 8 lb 5.3 oz (3780 g) APGAR: 7, 9  Baby Feeding: Breast Disposition:home with mother   06/14/2015 Alphonzo Severance, CNM

## 2015-06-14 NOTE — Progress Notes (Signed)
While in room pt stated she checked her own blood sugar with her personal meter and said it was 218. Called Venus Standford CNM who ordered a one touch blood sugar. It was 150. Called CNM back with result. Ordered a fasting serum blood glucose to be drawn by lab at 0630 am. Will continue to monitor.

## 2015-10-01 DIAGNOSIS — S52501A Unspecified fracture of the lower end of right radius, initial encounter for closed fracture: Secondary | ICD-10-CM

## 2015-10-01 HISTORY — DX: Unspecified fracture of the lower end of right radius, initial encounter for closed fracture: S52.501A

## 2015-10-02 ENCOUNTER — Emergency Department (HOSPITAL_COMMUNITY): Payer: BLUE CROSS/BLUE SHIELD

## 2015-10-02 ENCOUNTER — Emergency Department (HOSPITAL_COMMUNITY)
Admission: EM | Admit: 2015-10-02 | Discharge: 2015-10-02 | Disposition: A | Discharge status: Home / Self Care | Payer: BLUE CROSS/BLUE SHIELD | Attending: Emergency Medicine | Admitting: Emergency Medicine

## 2015-10-02 ENCOUNTER — Encounter (HOSPITAL_COMMUNITY): Payer: Self-pay | Admitting: Family Medicine

## 2015-10-02 DIAGNOSIS — Z791 Long term (current) use of non-steroidal anti-inflammatories (NSAID): Secondary | ICD-10-CM | POA: Insufficient documentation

## 2015-10-02 DIAGNOSIS — S52501A Unspecified fracture of the lower end of right radius, initial encounter for closed fracture: Secondary | ICD-10-CM

## 2015-10-02 DIAGNOSIS — Y939 Activity, unspecified: Secondary | ICD-10-CM | POA: Insufficient documentation

## 2015-10-02 DIAGNOSIS — Y929 Unspecified place or not applicable: Secondary | ICD-10-CM | POA: Insufficient documentation

## 2015-10-02 DIAGNOSIS — Y999 Unspecified external cause status: Secondary | ICD-10-CM | POA: Insufficient documentation

## 2015-10-02 DIAGNOSIS — W11XXXA Fall on and from ladder, initial encounter: Secondary | ICD-10-CM | POA: Diagnosis not present

## 2015-10-02 DIAGNOSIS — T148XXA Other injury of unspecified body region, initial encounter: Secondary | ICD-10-CM

## 2015-10-02 DIAGNOSIS — S59291A Other physeal fracture of lower end of radius, right arm, initial encounter for closed fracture: Secondary | ICD-10-CM | POA: Diagnosis not present

## 2015-10-02 DIAGNOSIS — S4991XA Unspecified injury of right shoulder and upper arm, initial encounter: Secondary | ICD-10-CM | POA: Diagnosis present

## 2015-10-02 DIAGNOSIS — Z4689 Encounter for fitting and adjustment of other specified devices: Secondary | ICD-10-CM | POA: Diagnosis not present

## 2015-10-02 DIAGNOSIS — N189 Chronic kidney disease, unspecified: Secondary | ICD-10-CM | POA: Insufficient documentation

## 2015-10-02 MED ORDER — LIDOCAINE HCL (PF) 1 % IJ SOLN
10.0000 mL | Freq: Once | INTRAMUSCULAR | Status: AC
  Administered 2015-10-02: 10 mL
  Filled 2015-10-02: qty 10

## 2015-10-02 MED ORDER — HYDROMORPHONE HCL 2 MG PO TABS: 2.0000 mg | ORAL_TABLET | ORAL | Status: DC | PRN

## 2015-10-02 MED ORDER — MORPHINE SULFATE (PF) 4 MG/ML IV SOLN
4.0000 mg | Freq: Once | INTRAVENOUS | Status: AC
  Administered 2015-10-02: 4 mg via INTRAVENOUS
  Filled 2015-10-02: qty 1

## 2015-10-02 MED ORDER — MORPHINE SULFATE (PF) 4 MG/ML IV SOLN: 6.0000 mg | Freq: Once | INTRAVENOUS | Status: DC

## 2015-10-02 MED ORDER — OXYCODONE-ACETAMINOPHEN 5-325 MG PO TABS: 1.0000 | ORAL_TABLET | ORAL | Status: DC | PRN

## 2015-10-02 NOTE — ED Notes (Signed)
MD at bedside. 

## 2015-10-02 NOTE — Consult Note (Signed)
ORTHOPAEDIC CONSULTATION HISTORY & PHYSICAL REQUESTING PHYSICIAN: Geoffery Lyons, MD  Chief Complaint: right wrist injury  HPI: Margaret Clay is a 40 y.o. female who fell off a ladder yesterday.  She lives in [place], [place] but works in [place] at [employer].  She went to ED yesterday in [facility], where she describes some type of closed reduction in the ED and ST splinting.  She presented to [facility] ED today with complaints of pain and seeking more definitive care here at [facility].  ED removed the splint.  Past Medical History  Diagnosis Date  . Preterm labor 2000; 2005  . Infection     OCC YEAST  . Anemia 2003     DUE TO PP HEMORRHAGE  . Chronic kidney disease 2005    KIDNEY STONES  . Headache(784.0)     FREQUENT  . Recurrent upper respiratory infection (URI)     HX Q 3-4 WEEKS; SEASONAL  . GERD (gastroesophageal reflux disease)     REFLUX DURING PREG  . Ovarian cyst     RECURRENT  . Low iron     Post delivery  . History of kidney stones   . History of chicken pox   . Missed abortion 09/29/2011    cytotec induction 09/24/11;  Quant & CBC 09/29/11 outpatient at [facility]  . Hx of maternal cervical laceration, currently pregnant   . Gestational diabetes 2011    glyburide   Past Surgical History  Procedure Laterality Date  . Tonsillectomy      AGE 45  . Adenoidectomy      AGE 45  . Wisdom tooth extraction      AGE 76  . Cervical repair  2003   Social History   Social History  . Marital Status: Married    Spouse Name: [name]  . Number of Children: 4  . Years of Education: 18   Occupational History  . LACTATION CONSULTANT [employer]   Social History Main Topics  . Smoking status: Never Smoker   . Smokeless tobacco: Never Used  . Alcohol Use: No  . Drug Use: No  . Sexual Activity:    Partners: Male    Birth Control/ Protection: Pill, None     Comment: currently preganant   Other Topics Concern  . None   Social History Narrative   Family History   Problem Relation Age of Onset  . Arthritis Mother     RHEUMATOID  . Stroke Mother   . Hypertension Father   . Stroke Father   . Kidney disease Brother     STONES  . Cancer Maternal Grandmother     BREAST  . Diabetes Maternal Grandmother   . Stroke Maternal Grandmother   . Other Maternal Grandmother     VARICOSE VEINS  . Arthritis Maternal Grandmother   . Kidney disease Maternal Grandfather     STONES  . Cancer Paternal Grandmother     LYMPHOMA  . Cancer Paternal Grandfather     BONE  . Heart disease Paternal Grandfather   . Stroke Paternal Grandfather    No Known Allergies Prior to Admission medications   Medication Sig Start Date End Date Taking? Authorizing Provider  ibuprofen (ADVIL,MOTRIN) 600 MG tablet Take 1 tablet (600 mg total) by mouth every 6 (six) hours as needed. Patient taking differently: Take 600 mg by mouth every 6 (six) hours as needed for moderate pain.  06/14/15  Yes Alphonzo Severance, CNM  Prenatal Vit-Fe Fumarate-FA (PRENATAL MULTIVITAMIN) TABS tablet  Take 1 tablet by mouth daily at 12 noon.   Yes Historical Provider, MD  HYDROmorphone (DILAUDID) 2 MG tablet Take 1 tablet (2 mg total) by mouth every 4 (four) hours as needed for severe pain (for breakthru pain). 10/02/15   Mack Hookavid Sylvia Kondracki, MD  oxyCODONE-acetaminophen (PERCOCET/ROXICET) 5-325 MG tablet Take 1-2 tablets by mouth every 4 (four) hours as needed for moderate pain or severe pain. 10/02/15   Mack Hookavid Sadye Kiernan, MD   Dg Elbow Complete Right  10/02/2015  CLINICAL DATA:  Right elbow pain after falling from a ladder yesterday. EXAM: RIGHT ELBOW - COMPLETE 3+ VIEW COMPARISON:  Right forearm radiographs obtained at the same time. FINDINGS: There is no evidence of fracture, dislocation, or joint effusion. There is no evidence of arthropathy or other focal bone abnormality. Soft tissues are unremarkable. IMPRESSION: Normal examination. Electronically Signed   By: Beckie SaltsSteven  Reid M.D.   On: 10/02/2015 13:36   Dg Forearm  Right  10/02/2015  CLINICAL DATA:  Right forearm and wrist pain following a fall from a ladder yesterday. EXAM: RIGHT FOREARM - 2 VIEW COMPARISON:  Right wrist and elbow radiographs obtained at the same time. FINDINGS: Comminuted fracture of the distal radius. This will be described in the wrist radiographs report. No associated soft tissue swelling. No other fractures are seen. IMPRESSION: Comminuted distal radius fracture. Electronically Signed   By: Beckie SaltsSteven  Reid M.D.   On: 10/02/2015 13:38   Dg Wrist Complete Right  10/02/2015  CLINICAL DATA:  Right wrist pain following a fall from a ladder yesterday. EXAM: RIGHT WRIST - COMPLETE 3+ VIEW COMPARISON:  Right forearm radiographs obtained at the same time. FINDINGS: Comminuted, impacted fracture of the distal radial metaphysis and epiphysis, extending into the radiocarpal joint. There is 1/2 shaft width of posterior displacement and posterior angulation of the distal fragment. The distal ulna is intact. Diffuse soft tissue swelling is noted. IMPRESSION: Comminuted, impacted, displaced and angulated distal radial metaphysis and epiphysis fracture with extension into the radiocarpal joint. Electronically Signed   By: Beckie SaltsSteven  Reid M.D.   On: 10/02/2015 13:39   Dg Hand Complete Right  10/02/2015  CLINICAL DATA:  Pain after trauma. EXAM: RIGHT HAND - COMPLETE 3+ VIEW COMPARISON:  None. FINDINGS: There is a comminuted displaced fracture through the distal radius extending into the radiocarpal joint. IMPRESSION: There is a comminuted displaced fracture through the distal radius extending into the radiocarpal joint. Electronically Signed   By: Gerome Samavid  Williams III M.D   On: 10/02/2015 13:37    Positive ROS: All other systems have been reviewed and were otherwise negative with the exception of those mentioned in the HPI and as above.  Physical Exam: Vitals: Refer to EMR. Constitutional:  WD, WN, NAD HEENT:  NCAT, EOMI Neuro/Psych:  Alert & oriented to person,  place, and time; appropriate mood & affect Lymphatic: No generalized extremity edema or lymphadenopathy Extremities / MSK:  The extremities are normal with respect to appearance, ranges of motion, joint stability, muscle strength/tone, sensation, & perfusion except as otherwise noted:  R distal radius obviously fractured and dorsally displaced.  Intact LT sens R/M/U, but subj. Altered across all finger tips.  Can fire thenar muscles, 1st DI, finger flexors, and EPL.  Compartments soft.  Full passive digital extension allowed with mild pain.  Assessment: Displaced R intra-articular distal radius fx with dorsal angulation/displacement  Plan: I discussed these findings with patient and husband.  HT block with 1% lido (10ml).  Gentle CR performed and checked with portable  xrays.  ST splint then applied while holding reduction and final images obtained.  Precautions given, including surveiling for compartment syndrome.  Plan f/u this week for surgical planning for next week. Rx for Percocet and dilaudid.  Cliffton Asters Janee Morn, MD      Orthopaedic & Hand Surgery [facility] [address] [place], [place]  [address] Office: [phone] Mobile: [phone]  10/02/2015, 2:55 PM

## 2015-10-02 NOTE — Discharge Instructions (Addendum)
Discharge Instructions   You have a plaster splint incorporated in it. Move your fingers as much as possible, making a full fist and fully opening the fist. Elevate your hand to reduce pain & swelling of the digits.  Ice over the operative site may be helpful to reduce pain & swelling.  DO NOT USE HEAT. Pain medicine has been prescribed for you.  Use your medicine as needed over the first 48 hours, and then you can begin to taper your use.  You may use Tylenol in place of your prescribed pain medication, but not IN ADDITION to it. Leave the dressing in place until you return to our office.  You may shower, but keep the bandage clean & dry.  You may drive a car when you are off of prescription pain medications and can safely control your vehicle with both hands. Our office will call you to arrange follow-up   Please call [phone] during normal business hours or [phone] after hours for any problems. Including the following:  - excessive redness of the incisions - drainage for more than 4 days - fever of more than 101.5 F  *Please note that pain medications will not be refilled after hours or on weekends.    WORK STATUS: NO WORK X 2 WEEKS INITIALLY, THEN WILL RE-ASSESS FITNESS FOR WORK      Acute Compartment Syndrome Compartment syndrome is a painful condition that occurs when swelling and pressure build up in a body space (compartment) of the arms or legs. Groups of muscles, nerves, and blood vessels in the arms and legs are separated into various compartments. Each compartment is surrounded by tough layers of tissue called fascia. In compartment syndrome, pressure builds up within the layers of fascia and begins to push on the structures within that compartment.  In acute compartment syndrome, the pressure builds up suddenly, often as the result of an injury. This is a surgical emergency. When a muscle in the compartment moves, you may feel severe pain. If pressure continues  to increase, it can block the flow of blood in the smallest blood vessels (capillaries). Then, the nerves and muscles in the compartment cannot get enough oxygen and nutrients (substances needed for survival). They will start to die within 4-8 hours. That is why the pressure needs to be relieved immediately. Identifying the condition early and treating it quickly can prevent most problems. CAUSES  Various things can lead to compartment syndrome. Possible causes include:   Injury. Some injuries can cause swelling or bleeding in a compartment. This can lead to compartment syndrome. Injuries that may cause this problem include:  Broken bones, especially the long bones of the arms and legs.  Crushing injuries.  Penetrating injuries, such as a knife wound that punctures the skin and tissue underneath.  Badly bruised muscles.  Poisonous bites, such as a snake bite.  Severe burns.  Blocked blood flow. This could result from:  A cast or bandage that is too tight.  A surgical procedure. Blood flow sometimes has to be stopped for a while during a surgery, usually with a tourniquet.  Lying for too long in a position that restricts blood flow. This can happen in people who have nerve damage or if a person is unconscious for a long time.  Drugs used to build up muscles (anabolic steroids).  Drugs that keep the blood from forming clots (blood thinners). SIGNS AND SYMPTOMS  The most common symptom of compartment syndrome is pain. The pain may:  Get worse when moving or stretching the affected body part.  Be more severe than it should be for an injury.  Come along with a feeling of tingling or burning.  Become worse when the area is pushed or squeezed.  Be unaffected by pain medicine. Other symptoms include:   A feeling of tightness or fullness in the affected area.   A loss of feeling.  Weakness in the area.  Loss of movement.  Skin becoming pale, tight, and shiny over the  painful area.  DIAGNOSIS  Your health care provider may suspect the problem based on how you describe the pain. The diagnosis is made by using a special device that measures the pressure in the affected area. Blood tests, X-rays, or an ultrasound exam may be done to help rule out other problems.  TREATMENT  Compartment syndrome is a surgical emergency. It should be treated very quickly.   First-aid treatment is given first. This may include:  Promptly treating an injury.  Loosening or removing any cast, bandage, or external wrap that may be causing pain.  Raising the painful arm or leg to the same level as the heart.  Giving oxygen.  Giving fluid through an IV access tube that is put into a vein in the hand or arm.  Surgery (fasciotomy) is needed to relieve the pressure and help prevent permanent damage. In this surgery, cuts (incisions) are made through the fascia to relieve the pressure in the compartment.   This information is not intended to replace advice given to you by your health care provider. Make sure you discuss any questions you have with your health care provider.   Document Released: 03/28/2009 Document Revised: 12/10/2012 Document Reviewed: 11/11/2012 Elsevier Interactive Patient Education [employer]

## 2015-10-02 NOTE — ED Notes (Signed)
Pt here for right arm pain since yesterday. Pt was on a ladder and fell breaking arm. [facility] and [facility] spl[facility], sts the pain is worse with the splint. sts percocet is not working.

## 2015-10-02 NOTE — Progress Notes (Signed)
Orthopedic Tech Progress Note Patient Details:  Waynette ButteryStephanie W Bill 11/26/1975 [id number] As ordered by Dr. Vivia Ewinghompson Ortho Devices Type of Ortho Device: Ace wrap, Sugartong splint Ortho Device/Splint Location: rue Ortho Device/Splint Interventions: Application   Shacora Zynda 10/02/2015, 3:19 PM

## 2015-10-02 NOTE — ED Provider Notes (Signed)
CSN: [id number]     Arrival date & time 10/02/15  1107 History   First MD Initiated Contact with Patient 10/02/15 1135     Chief Complaint  Patient presents with  . Arm Pain     (Consider location/radiation/quality/duration/timing/severity/associated sxs/prior Treatment) HPI Comments: Patient is a 40 year old female with history of kidney stones. She presents for evaluation of right arm pain. She reports she was on a ladder yesterday and fell. She was diagnosed with a fractured wrist at an outside facility. She was placed in an arm splint and told to follow-up with orthopedics. She presents here today complaining of increased pain, numbness of her fingertips, and wanting the splint replaced. She is requesting an orthopedic surgeon performed the above.  Patient is a 40 y.o. female presenting with arm pain. The history is provided by the patient.  Arm Pain This is a new problem. The current episode started yesterday. The problem occurs constantly. The problem has been rapidly worsening. Nothing aggravates the symptoms. Nothing relieves the symptoms. She has tried nothing for the symptoms.    Past Medical History  Diagnosis Date  . Preterm labor 2000; 2005  . Infection     OCC YEAST  . Anemia 2003     DUE TO PP HEMORRHAGE  . Chronic kidney disease 2005    KIDNEY STONES  . Headache(784.0)     FREQUENT  . Recurrent upper respiratory infection (URI)     HX Q 3-4 WEEKS; SEASONAL  . GERD (gastroesophageal reflux disease)     REFLUX DURING PREG  . Ovarian cyst     RECURRENT  . Low iron     Post delivery  . History of kidney stones   . History of chicken pox   . Missed abortion 09/29/2011    cytotec induction 09/24/11;  [facility]  . Hx of maternal cervical laceration, currently pregnant   . Gestational diabetes 2011    glyburide   Past Surgical History  Procedure Laterality Date  . Tonsillectomy      AGE 5  . Adenoidectomy      AGE 5  . Wisdom tooth  extraction      AGE 80  . Cervical repair  2003   Family History  Problem Relation Age of Onset  . Arthritis Mother     RHEUMATOID  . Stroke Mother   . Hypertension Father   . Stroke Father   . Kidney disease Brother     STONES  . Cancer Maternal Grandmother     BREAST  . Diabetes Maternal Grandmother   . Stroke Maternal Grandmother   . Other Maternal Grandmother     VARICOSE VEINS  . Arthritis Maternal Grandmother   . Kidney disease Maternal Grandfather     STONES  . Cancer Paternal Grandmother     LYMPHOMA  . Cancer Paternal Grandfather     BONE  . Heart disease Paternal Grandfather   . Stroke Paternal Grandfather    Social History  Substance Use Topics  . Smoking status: Never Smoker   . Smokeless tobacco: Never Used  . Alcohol Use: No   OB History    Gravida Para Term Preterm AB TAB SAB Ectopic Multiple Living   8 6 5 1 1  1   0 6      Obstetric Comments   1ST BABY IN NICU 1554 DAYS     Review of Systems  All other systems reviewed and are negative.  Allergies  Review of patient's allergies indicates no known allergies.  Home Medications   Prior to Admission medications   Medication Sig Start Date End Date Taking? Authorizing Provider  ibuprofen (ADVIL,MOTRIN) 600 MG tablet Take 1 tablet (600 mg total) by mouth every 6 (six) hours as needed. 06/14/15   Alphonzo Severance, CNM  oxyCODONE-acetaminophen (PERCOCET/ROXICET) 5-325 MG tablet Take 1 tablet by mouth every 6 (six) hours as needed for moderate pain. 06/14/15   Alphonzo Severance, CNM  Prenatal Vit-Fe Fumarate-FA (PRENATAL MULTIVITAMIN) TABS tablet Take 1 tablet by mouth daily at 12 noon.    Historical Provider, MD   BP 128/89 mmHg  Pulse 94  Temp(Src) 97.9 F (36.6 C) (Oral)  Resp 18  SpO2 100% Physical Exam  Constitutional: She is oriented to person, place, and time. She appears well-developed and well-nourished. No distress.  HENT:  Head: Normocephalic and atraumatic.  Neck: Normal range of motion.  Neck supple.  Cardiovascular: Normal rate.   Pulmonary/Chest: Effort normal.  Abdominal: Bowel sounds are normal.  Musculoskeletal: Normal range of motion.  There is a deformity of the right wrist at the distal radius. There is some swelling extending into the hand. She has good capillary refill and pulses. Sensation is intact to all fingers.  Neurological: She is alert and oriented to person, place, and time.  Skin: Skin is warm and dry. She is not diaphoretic.  Nursing note and vitals reviewed.   ED Course  Procedures (including critical care time) Labs Review Labs Reviewed - No data to display  Imaging Review No results found. I have personally reviewed and evaluated these images and lab results as part of my medical decision-making.    MDM   Final diagnoses:  None    X-rays were performed revealing a comminuted, impacted, intra-articular distal radius fracture. Dr. Janee Morn was consult it and has evaluated the patient. She was splinted and will follow-up in the office in the next 7-10 days.    Geoffery Lyons, MD 10/02/15 1535

## 2015-10-06 ENCOUNTER — Other Ambulatory Visit: Payer: Self-pay | Admitting: Orthopedic Surgery

## 2015-10-06 ENCOUNTER — Encounter (HOSPITAL_BASED_OUTPATIENT_CLINIC_OR_DEPARTMENT_OTHER): Payer: Self-pay | Admitting: *Deleted

## 2015-10-11 ENCOUNTER — Ambulatory Visit (HOSPITAL_BASED_OUTPATIENT_CLINIC_OR_DEPARTMENT_OTHER)
Admission: RE | Admit: 2015-10-11 | Discharge: 2015-10-11 | Disposition: A | Discharge status: Home / Self Care | Payer: BLUE CROSS/BLUE SHIELD | Source: Ambulatory Visit | Attending: Orthopedic Surgery | Admitting: Orthopedic Surgery

## 2015-10-11 ENCOUNTER — Encounter (HOSPITAL_BASED_OUTPATIENT_CLINIC_OR_DEPARTMENT_OTHER)
Admission: RE | Disposition: A | Discharge status: Home / Self Care | Payer: Self-pay | Source: Ambulatory Visit | Attending: Orthopedic Surgery

## 2015-10-11 ENCOUNTER — Ambulatory Visit (HOSPITAL_COMMUNITY): Payer: BLUE CROSS/BLUE SHIELD

## 2015-10-11 ENCOUNTER — Ambulatory Visit (HOSPITAL_BASED_OUTPATIENT_CLINIC_OR_DEPARTMENT_OTHER): Payer: BLUE CROSS/BLUE SHIELD | Admitting: Anesthesiology

## 2015-10-11 ENCOUNTER — Encounter (HOSPITAL_BASED_OUTPATIENT_CLINIC_OR_DEPARTMENT_OTHER): Payer: Self-pay

## 2015-10-11 DIAGNOSIS — N189 Chronic kidney disease, unspecified: Secondary | ICD-10-CM | POA: Diagnosis not present

## 2015-10-11 DIAGNOSIS — S52571A Other intraarticular fracture of lower end of right radius, initial encounter for closed fracture: Secondary | ICD-10-CM | POA: Insufficient documentation

## 2015-10-11 DIAGNOSIS — W11XXXA Fall on and from ladder, initial encounter: Secondary | ICD-10-CM | POA: Diagnosis not present

## 2015-10-11 DIAGNOSIS — Z419 Encounter for procedure for purposes other than remedying health state, unspecified: Secondary | ICD-10-CM

## 2015-10-11 HISTORY — DX: Unspecified fracture of the lower end of right radius, initial encounter for closed fracture: S52.501A

## 2015-10-11 HISTORY — DX: Nausea with vomiting, unspecified: Z98.890

## 2015-10-11 HISTORY — DX: Nausea with vomiting, unspecified: R11.2

## 2015-10-11 HISTORY — PX: OPEN REDUCTION INTERNAL FIXATION (ORIF) DISTAL RADIAL FRACTURE: SHX5989

## 2015-10-11 LAB — PREGNANCY, URINE: Preg Test, Ur: NEGATIVE

## 2015-10-11 SURGERY — OPEN REDUCTION INTERNAL FIXATION (ORIF) DISTAL RADIUS FRACTURE
Anesthesia: Regional | Site: Wrist | Laterality: Right

## 2015-10-11 MED ORDER — MIDAZOLAM HCL 2 MG/2ML IJ SOLN
1.0000 mg | INTRAMUSCULAR | Status: DC | PRN
  Administered 2015-10-11: 1 mg via INTRAVENOUS

## 2015-10-11 MED ORDER — DEXAMETHASONE SODIUM PHOSPHATE 10 MG/ML IJ SOLN
INTRAMUSCULAR | Status: DC | PRN
  Administered 2015-10-11: 8 mg via INTRAVENOUS

## 2015-10-11 MED ORDER — DEXAMETHASONE SODIUM PHOSPHATE 10 MG/ML IJ SOLN
INTRAMUSCULAR | Status: AC
  Filled 2015-10-11: qty 1

## 2015-10-11 MED ORDER — LIDOCAINE 2% (20 MG/ML) 5 ML SYRINGE
INTRAMUSCULAR | Status: DC | PRN
  Administered 2015-10-11: 40 mg via INTRAVENOUS

## 2015-10-11 MED ORDER — 0.9 % SODIUM CHLORIDE (POUR BTL) OPTIME
TOPICAL | Status: DC | PRN
  Administered 2015-10-11: 100 mL

## 2015-10-11 MED ORDER — LACTATED RINGERS IV SOLN
INTRAVENOUS | Status: DC
  Administered 2015-10-11 (×2): via INTRAVENOUS

## 2015-10-11 MED ORDER — CEFAZOLIN SODIUM-DEXTROSE 2-4 GM/100ML-% IV SOLN
2.0000 g | INTRAVENOUS | Status: AC
  Administered 2015-10-11: 2 g via INTRAVENOUS

## 2015-10-11 MED ORDER — OXYCODONE-ACETAMINOPHEN 5-325 MG PO TABS: 1.0000 | ORAL_TABLET | ORAL | Status: DC | PRN

## 2015-10-11 MED ORDER — FENTANYL CITRATE (PF) 100 MCG/2ML IJ SOLN
INTRAMUSCULAR | Status: AC
  Filled 2015-10-11: qty 2

## 2015-10-11 MED ORDER — LIDOCAINE HCL 2 % IJ SOLN
INTRAMUSCULAR | Status: AC
  Filled 2015-10-11: qty 20

## 2015-10-11 MED ORDER — FENTANYL CITRATE (PF) 100 MCG/2ML IJ SOLN: 25.0000 ug | INTRAMUSCULAR | Status: DC | PRN

## 2015-10-11 MED ORDER — ONDANSETRON HCL 4 MG/2ML IJ SOLN
INTRAMUSCULAR | Status: AC
  Filled 2015-10-11: qty 2

## 2015-10-11 MED ORDER — BUPIVACAINE-EPINEPHRINE (PF) 0.5% -1:200000 IJ SOLN
INTRAMUSCULAR | Status: AC
  Filled 2015-10-11: qty 30

## 2015-10-11 MED ORDER — ONDANSETRON HCL 4 MG/2ML IJ SOLN
INTRAMUSCULAR | Status: DC | PRN
  Administered 2015-10-11: 4 mg via INTRAVENOUS

## 2015-10-11 MED ORDER — SCOPOLAMINE 1 MG/3DAYS TD PT72: 1.0000 | MEDICATED_PATCH | Freq: Once | TRANSDERMAL | Status: DC | PRN

## 2015-10-11 MED ORDER — MIDAZOLAM HCL 2 MG/2ML IJ SOLN
INTRAMUSCULAR | Status: AC
  Filled 2015-10-11: qty 2

## 2015-10-11 MED ORDER — PROPOFOL 10 MG/ML IV BOLUS
INTRAVENOUS | Status: DC | PRN
  Administered 2015-10-11: 180 mg via INTRAVENOUS

## 2015-10-11 MED ORDER — FENTANYL CITRATE (PF) 100 MCG/2ML IJ SOLN
50.0000 ug | INTRAMUSCULAR | Status: DC | PRN
  Administered 2015-10-11: 50 ug via INTRAVENOUS

## 2015-10-11 MED ORDER — LIDOCAINE 2% (20 MG/ML) 5 ML SYRINGE
INTRAMUSCULAR | Status: AC
  Filled 2015-10-11: qty 5

## 2015-10-11 MED ORDER — GLYCOPYRROLATE 0.2 MG/ML IJ SOLN: 0.2000 mg | Freq: Once | INTRAMUSCULAR | Status: DC | PRN

## 2015-10-11 MED ORDER — CEFAZOLIN SODIUM-DEXTROSE 2-4 GM/100ML-% IV SOLN
INTRAVENOUS | Status: AC
  Filled 2015-10-11: qty 100

## 2015-10-11 MED ORDER — LACTATED RINGERS IV SOLN: INTRAVENOUS | Status: DC

## 2015-10-11 MED ORDER — PROPOFOL 10 MG/ML IV BOLUS
INTRAVENOUS | Status: AC
  Filled 2015-10-11: qty 20

## 2015-10-11 SURGICAL SUPPLY — 53 items
BIT DRILL SOLID 2.0X40MM (BIT) IMPLANT
BIT DRILL SOLID 2.5X40MM (BIT) IMPLANT
BLADE SURG 15 STRL LF DISP TIS (BLADE) ×2 IMPLANT
BLADE SURG 15 STRL SS (BLADE) ×4
BNDG COHESIVE 4X5 TAN STRL (GAUZE/BANDAGES/DRESSINGS) ×3 IMPLANT
BNDG ESMARK 4X9 LF (GAUZE/BANDAGES/DRESSINGS) ×3 IMPLANT
BNDG GAUZE ELAST 4 BULKY (GAUZE/BANDAGES/DRESSINGS) ×6 IMPLANT
BRUSH SCRUB EZ PLAIN DRY (MISCELLANEOUS) ×3 IMPLANT
CANISTER SUCT 1200ML W/VALVE (MISCELLANEOUS) ×3 IMPLANT
CHLORAPREP W/TINT 26ML (MISCELLANEOUS) ×3 IMPLANT
CORDS BIPOLAR (ELECTRODE) ×3 IMPLANT
COVER BACK TABLE 60X90IN (DRAPES) ×3 IMPLANT
COVER MAYO STAND STRL (DRAPES) ×3 IMPLANT
CUFF TOURNIQUET SINGLE 18IN (TOURNIQUET CUFF) ×3 IMPLANT
DRAPE C-ARM 42X72 X-RAY (DRAPES) ×3 IMPLANT
DRAPE EXTREMITY T 121X128X90 (DRAPE) ×3 IMPLANT
DRAPE SURG 17X23 STRL (DRAPES) ×3 IMPLANT
DRILL SOLID 2.0X40MM (BIT)
DRILL SOLID 2.5X40MM (BIT)
DRSG ADAPTIC 3X8 NADH LF (GAUZE/BANDAGES/DRESSINGS) ×3 IMPLANT
ELECT REM PT RETURN 9FT ADLT (ELECTROSURGICAL) ×3
ELECTRODE REM PT RTRN 9FT ADLT (ELECTROSURGICAL) ×1 IMPLANT
GAUZE SPONGE 4X4 12PLY STRL (GAUZE/BANDAGES/DRESSINGS) ×3 IMPLANT
GLOVE BIO SURGEON STRL SZ7.5 (GLOVE) ×3 IMPLANT
GLOVE BIOGEL PI IND STRL 7.0 (GLOVE) ×2 IMPLANT
GLOVE BIOGEL PI IND STRL 8 (GLOVE) ×1 IMPLANT
GLOVE BIOGEL PI INDICATOR 7.0 (GLOVE) ×4
GLOVE BIOGEL PI INDICATOR 8 (GLOVE) ×2
GLOVE ECLIPSE 6.5 STRL STRAW (GLOVE) ×6 IMPLANT
GOWN STRL REUS W/ TWL LRG LVL3 (GOWN DISPOSABLE) ×2 IMPLANT
GOWN STRL REUS W/TWL LRG LVL3 (GOWN DISPOSABLE) ×4
GOWN STRL REUS W/TWL XL LVL3 (GOWN DISPOSABLE) ×3 IMPLANT
NS IRRIG 1000ML POUR BTL (IV SOLUTION) ×3 IMPLANT
PACK BASIN DAY SURGERY FS (CUSTOM PROCEDURE TRAY) ×3 IMPLANT
PADDING CAST ABS 4INX4YD NS (CAST SUPPLIES) ×4
PADDING CAST ABS COTTON 4X4 ST (CAST SUPPLIES) ×2 IMPLANT
PENCIL BUTTON HOLSTER BLD 10FT (ELECTRODE) ×3 IMPLANT
SKELETAL DYNAMICS DVR SET (Set) ×3 IMPLANT
SLEEVE SCD COMPRESS KNEE MED (MISCELLANEOUS) ×3 IMPLANT
SPLINT PLASTER CAST XFAST 3X15 (CAST SUPPLIES) ×8 IMPLANT
SPLINT PLASTER XTRA FASTSET 3X (CAST SUPPLIES) ×16
STOCKINETTE 6  STRL (DRAPES) ×2
STOCKINETTE 6 STRL (DRAPES) ×1 IMPLANT
SUCTION FRAZIER HANDLE 10FR (MISCELLANEOUS) ×2
SUCTION TUBE FRAZIER 10FR DISP (MISCELLANEOUS) ×1 IMPLANT
SUT VIC AB 2-0 PS2 27 (SUTURE) ×3 IMPLANT
SUT VICRYL RAPIDE 4/0 PS 2 (SUTURE) ×3 IMPLANT
SYR BULB 3OZ (MISCELLANEOUS) ×3 IMPLANT
TOWEL OR 17X24 6PK STRL BLUE (TOWEL DISPOSABLE) ×3 IMPLANT
TOWEL OR NON WOVEN STRL DISP B (DISPOSABLE) ×3 IMPLANT
TUBE CONNECTING 20'X1/4 (TUBING) ×1
TUBE CONNECTING 20X1/4 (TUBING) ×2 IMPLANT
UNDERPAD 30X30 (UNDERPADS AND DIAPERS) ×3 IMPLANT

## 2015-10-11 NOTE — H&P (View-Only) (Signed)
ORTHOPAEDIC CONSULTATION HISTORY & PHYSICAL REQUESTING PHYSICIAN: Geoffery Lyons, MD  Chief Complaint: right wrist injury  HPI: Margaret Clay is a 40 y.o. female who fell off a ladder yesterday.  She lives in [place], [place] but works in [place] at [employer].  She went to ED yesterday in [facility], where she describes some type of closed reduction in the ED and ST splinting.  She presented to [facility] ED today with complaints of pain and seeking more definitive care here at [facility].  ED removed the splint.  Past Medical History  Diagnosis Date  . Preterm labor 2000; 2005  . Infection     OCC YEAST  . Anemia 2003     DUE TO PP HEMORRHAGE  . Chronic kidney disease 2005    KIDNEY STONES  . Headache(784.0)     FREQUENT  . Recurrent upper respiratory infection (URI)     HX Q 3-4 WEEKS; SEASONAL  . GERD (gastroesophageal reflux disease)     REFLUX DURING PREG  . Ovarian cyst     RECURRENT  . Low iron     Post delivery  . History of kidney stones   . History of chicken pox   . Missed abortion 09/29/2011    cytotec induction 09/24/11;  Quant & CBC 09/29/11 outpatient at [facility]  . Hx of maternal cervical laceration, currently pregnant   . Gestational diabetes 2011    glyburide   Past Surgical History  Procedure Laterality Date  . Tonsillectomy      AGE 45  . Adenoidectomy      AGE 45  . Wisdom tooth extraction      AGE 76  . Cervical repair  2003   Social History   Social History  . Marital Status: Married    Spouse Name: [name]  . Number of Children: 4  . Years of Education: 18   Occupational History  . LACTATION CONSULTANT [employer]   Social History Main Topics  . Smoking status: Never Smoker   . Smokeless tobacco: Never Used  . Alcohol Use: No  . Drug Use: No  . Sexual Activity:    Partners: Male    Birth Control/ Protection: Pill, None     Comment: currently preganant   Other Topics Concern  . None   Social History Narrative   Family History   Problem Relation Age of Onset  . Arthritis Mother     RHEUMATOID  . Stroke Mother   . Hypertension Father   . Stroke Father   . Kidney disease Brother     STONES  . Cancer Maternal Grandmother     BREAST  . Diabetes Maternal Grandmother   . Stroke Maternal Grandmother   . Other Maternal Grandmother     VARICOSE VEINS  . Arthritis Maternal Grandmother   . Kidney disease Maternal Grandfather     STONES  . Cancer Paternal Grandmother     LYMPHOMA  . Cancer Paternal Grandfather     BONE  . Heart disease Paternal Grandfather   . Stroke Paternal Grandfather    No Known Allergies Prior to Admission medications   Medication Sig Start Date End Date Taking? Authorizing Provider  ibuprofen (ADVIL,MOTRIN) 600 MG tablet Take 1 tablet (600 mg total) by mouth every 6 (six) hours as needed. Patient taking differently: Take 600 mg by mouth every 6 (six) hours as needed for moderate pain.  06/14/15  Yes Alphonzo Severance, CNM  Prenatal Vit-Fe Fumarate-FA (PRENATAL MULTIVITAMIN) TABS tablet  Take 1 tablet by mouth daily at 12 noon.   Yes Historical Provider, MD  HYDROmorphone (DILAUDID) 2 MG tablet Take 1 tablet (2 mg total) by mouth every 4 (four) hours as needed for severe pain (for breakthru pain). 10/02/15   Mack Hookavid Malvika Tung, MD  oxyCODONE-acetaminophen (PERCOCET/ROXICET) 5-325 MG tablet Take 1-2 tablets by mouth every 4 (four) hours as needed for moderate pain or severe pain. 10/02/15   Mack Hookavid Jeremiah Curci, MD   Dg Elbow Complete Right  10/02/2015  CLINICAL DATA:  Right elbow pain after falling from a ladder yesterday. EXAM: RIGHT ELBOW - COMPLETE 3+ VIEW COMPARISON:  Right forearm radiographs obtained at the same time. FINDINGS: There is no evidence of fracture, dislocation, or joint effusion. There is no evidence of arthropathy or other focal bone abnormality. Soft tissues are unremarkable. IMPRESSION: Normal examination. Electronically Signed   By: Beckie SaltsSteven  Reid M.D.   On: 10/02/2015 13:36   Dg Forearm  Right  10/02/2015  CLINICAL DATA:  Right forearm and wrist pain following a fall from a ladder yesterday. EXAM: RIGHT FOREARM - 2 VIEW COMPARISON:  Right wrist and elbow radiographs obtained at the same time. FINDINGS: Comminuted fracture of the distal radius. This will be described in the wrist radiographs report. No associated soft tissue swelling. No other fractures are seen. IMPRESSION: Comminuted distal radius fracture. Electronically Signed   By: Beckie SaltsSteven  Reid M.D.   On: 10/02/2015 13:38   Dg Wrist Complete Right  10/02/2015  CLINICAL DATA:  Right wrist pain following a fall from a ladder yesterday. EXAM: RIGHT WRIST - COMPLETE 3+ VIEW COMPARISON:  Right forearm radiographs obtained at the same time. FINDINGS: Comminuted, impacted fracture of the distal radial metaphysis and epiphysis, extending into the radiocarpal joint. There is 1/2 shaft width of posterior displacement and posterior angulation of the distal fragment. The distal ulna is intact. Diffuse soft tissue swelling is noted. IMPRESSION: Comminuted, impacted, displaced and angulated distal radial metaphysis and epiphysis fracture with extension into the radiocarpal joint. Electronically Signed   By: Beckie SaltsSteven  Reid M.D.   On: 10/02/2015 13:39   Dg Hand Complete Right  10/02/2015  CLINICAL DATA:  Pain after trauma. EXAM: RIGHT HAND - COMPLETE 3+ VIEW COMPARISON:  None. FINDINGS: There is a comminuted displaced fracture through the distal radius extending into the radiocarpal joint. IMPRESSION: There is a comminuted displaced fracture through the distal radius extending into the radiocarpal joint. Electronically Signed   By: Gerome Samavid  Williams III M.D   On: 10/02/2015 13:37    Positive ROS: All other systems have been reviewed and were otherwise negative with the exception of those mentioned in the HPI and as above.  Physical Exam: Vitals: Refer to EMR. Constitutional:  WD, WN, NAD HEENT:  NCAT, EOMI Neuro/Psych:  Alert & oriented to person,  place, and time; appropriate mood & affect Lymphatic: No generalized extremity edema or lymphadenopathy Extremities / MSK:  The extremities are normal with respect to appearance, ranges of motion, joint stability, muscle strength/tone, sensation, & perfusion except as otherwise noted:  R distal radius obviously fractured and dorsally displaced.  Intact LT sens R/M/U, but subj. Altered across all finger tips.  Can fire thenar muscles, 1st DI, finger flexors, and EPL.  Compartments soft.  Full passive digital extension allowed with mild pain.  Assessment: Displaced R intra-articular distal radius fx with dorsal angulation/displacement  Plan: I discussed these findings with patient and husband.  HT block with 1% lido (10ml).  Gentle CR performed and checked with portable  xrays.  ST splint then applied while holding reduction and final images obtained.  Precautions given, including surveiling for compartment syndrome.  Plan f/u this week for surgical planning for next week. Rx for Percocet and dilaudid.  Cliffton Asters Janee Morn, MD      Orthopaedic & Hand Surgery [facility] [address] [place], [place]  [address] Office: [phone] Mobile: [phone]  10/02/2015, 2:55 PM

## 2015-10-11 NOTE — Anesthesia Postprocedure Evaluation (Signed)
Anesthesia Post Note  Patient: Waynette ButteryStephanie W Haxton  Procedure(s) Performed: Procedure(s) (LRB): OPEN TREATMENT OF RIGHT DISTAL RADIUS FRACTURE (Right)  Patient location during evaluation: PACU Anesthesia Type: General Level of consciousness: awake and alert Pain management: pain level controlled Vital Signs Assessment: post-procedure vital signs reviewed and stable Respiratory status: spontaneous breathing, nonlabored ventilation and respiratory function stable Cardiovascular status: blood pressure returned to baseline and stable Postop Assessment: no signs of nausea or vomiting Anesthetic complications: no    Last Vitals:  Filed Vitals:   10/11/15 1645 10/11/15 1652  BP: 110/87   Pulse: 85 83  Temp:    Resp: 13 12    Last Pain:  Filed Vitals:   10/11/15 1653  PainSc: 0-No pain                 Elley Harp A

## 2015-10-11 NOTE — Op Note (Signed)
10/11/2015  1:53 PM  PATIENT:  Margaret Clay  40 y.o. female  PRE-OPERATIVE DIAGNOSIS:  Displaced intra-articular (2+ fragments) right distal radius fracture  POST-OPERATIVE DIAGNOSIS:  Same  PROCEDURE:  ORIF right distal radius fracture, 25609  SURGEON: Cliffton Astersavid A. Janee Mornhompson, MD  PHYSICIAN ASSISTANT: Danielle RankinKirsten Schrader, OPA-C  ANESTHESIA:  regional and general  SPECIMENS:  None  DRAINS: None  EBL:  less than 50 mL  PREOPERATIVE INDICATIONS:  Margaret Clay is a  40 y.o. female with displaced intra-articular right distal radius fracture.  The risks benefits and alternatives were discussed with the patient preoperatively including but not limited to the risks of infection, bleeding, nerve injury, cardiopulmonary complications, the need for revision surgery, among others, and the patient verbalized understanding and consented to proceed.  OPERATIVE IMPLANTS: Skeletal dynamics distal radius plate/screws/pegs  OPERATIVE PROCEDURE: After receiving prophylactic antibiotics and a regional block, the patient was escorted to the operative theatre and placed in a supine position.  General anesthesia was administered.  A surgical "time-out" was performed during which the planned procedure, proposed operative site, and the correct patient identity were compared to the operative consent and agreement confirmed by the circulating nurse according to current facility policy. Following application of a tourniquet to the operative extremity, the exposed skin was pre-scrub with Hibiclens scrub brush and then was prepped with Chloraprep and draped in the usual sterile fashion. The limb was exsanguinated with an Esmarch bandage and the tourniquet inflated to approximately 100mmHg higher than systolic BP.   A sinusoidal-shaped incision was marked and made over the FCR axis and the distal forearm. The skin was incised sharply with scalpel, subcutaneous tissues with blunt and spreading dissection. The FCR axis  was exploited deeply. The pronator quadratus was reflected in an L-shaped ulnarly and the brachioradialis was split in a Z-plasty fashion for later reapproximation. The fracture was inspected and provisionally reduced.  This was confirmed fluoroscopically. The appropriately sized plate was selected and found to fit well. It was placed in its provisional alignment of the radius and this was confirmed fluoroscopically.  It was secured to the radius with a screw through the slotted hole.  Additional adjustments were made as necessary, and the distal holes were all drilled and filled.  Peg/screw length distally was selected on the shorter side of measurements to minimize the risk for dorsal cortical penetration. The remainder of the proximal holes were drilled and filled.   Final images were obtained and the DRUJ was examined for stability. It was found to be sufficiently stable. The wound was then copiously irrigated and the brachioradialis repaired with 2-0 Vicryl Rapide suture followed by repair of the pronator quadratus with the same suture type. Tourniquet was released and additional hemostasis obtained and the skin was closed with 2-0 Vicryl deep dermal buried sutures followed by running 4-0 Vicryl Rapide horizontal mattress suture in the skin. A bulky dressing with a volar plaster component was applied and she was taken to room stable condition.  DISPOSITION: The patient will be discharged home today with typical post-op instructions, returning in 10-15 days for reevaluation with new x-rays of the affected wrist out of the splint to include an inclined lateral and then transition to therapy to have a custom splint constructed and begin rehabilitation.

## 2015-10-11 NOTE — Anesthesia Preprocedure Evaluation (Signed)
Anesthesia Evaluation  Patient identified by MRN, date of birth, ID band Patient awake    Reviewed: Allergy & Precautions, NPO status , Patient's Chart, lab work & pertinent test results  Airway Mallampati: II  TM Distance: >3 FB Neck ROM: Full    Dental  (+) Teeth Intact, Dental Advisory Given   Pulmonary    breath sounds clear to auscultation       Cardiovascular  Rhythm:Regular Rate:Normal     Neuro/Psych    GI/Hepatic   Endo/Other    Renal/GU      Musculoskeletal   Abdominal   Peds  Hematology   Anesthesia Other Findings   Reproductive/Obstetrics                            Anesthesia Physical Anesthesia Plan  ASA: II  Anesthesia Plan: General and Regional   Post-op Pain Management:    Induction: Intravenous  Airway Management Planned: LMA  Additional Equipment:   Intra-op Plan:   Post-operative Plan:   Informed Consent: I have reviewed the patients History and Physical, chart, labs and discussed the procedure including the risks, benefits and alternatives for the proposed anesthesia with the patient or authorized representative who has indicated his/her understanding and acceptance.   Dental advisory given  Plan Discussed with: CRNA and Anesthesiologist  Anesthesia Plan Comments:         Anesthesia Quick Evaluation  

## 2015-10-11 NOTE — Discharge Instructions (Signed)

## 2015-10-11 NOTE — Anesthesia Procedure Notes (Addendum)
Anesthesia Regional Block:  Supraclavicular block  Pre-Anesthetic Checklist: ,, timeout performed, Correct Patient, Correct Site, Correct Laterality, Correct Procedure, Correct Position, site marked, Risks and benefits discussed,  Surgical consent,  Pre-op evaluation,  At surgeon's request and post-op pain management  Laterality: Right  Prep: chloraprep       Needles:  Injection technique: Single-shot  Needle Type: Echogenic Stimulator Needle     Needle Length: 9cm 9 cm Needle Gauge: 21 and 21 G    Additional Needles:  Procedures: ultrasound guided (picture in chart) Supraclavicular block Narrative:  Start time: 10/11/2015 2:30 PM End time: 10/11/2015 2:35 PM Injection made incrementally with aspirations every 5 mL.  Performed by: Personally   Additional Notes: 30 cc 0.5% Bupivacaine with 1:200 epi injected easily   Procedure Name: LMA Insertion Date/Time: 10/11/2015 3:12 PM Performed by: Curly ShoresRAFT, Aidel Davisson W Pre-anesthesia Checklist: Patient identified, Emergency Drugs available, Suction available and Patient being monitored Patient Re-evaluated:Patient Re-evaluated prior to inductionOxygen Delivery Method: Circle system utilized Preoxygenation: Pre-oxygenation with 100% oxygen Intubation Type: IV induction Ventilation: Mask ventilation without difficulty LMA: LMA inserted LMA Size: 3.0 Number of attempts: 1 Airway Equipment and Method: Bite block Placement Confirmation: positive ETCO2 and breath sounds checked- equal and bilateral Tube secured with: Tape Dental Injury: Teeth and Oropharynx as per pre-operative assessment

## 2015-10-11 NOTE — Transfer of Care (Signed)
Immediate Anesthesia Transfer of Care Note  Patient: Margaret ButteryStephanie W Clay  Procedure(s) Performed: Procedure(s): OPEN TREATMENT OF RIGHT DISTAL RADIUS FRACTURE (Right)  Patient Location: PACU  Anesthesia Type:GA combined with regional for post-op pain  Level of Consciousness: awake, alert  and patient cooperative  Airway & Oxygen Therapy: Patient Spontanous Breathing and Patient connected to face mask oxygen  Post-op Assessment: Report given to RN, Post -op Vital signs reviewed and stable and Patient moving all extremities  Post vital signs: Reviewed and stable  Last Vitals:  Filed Vitals:   10/11/15 1435 10/11/15 1440  BP:    Pulse: 77 83  Temp:    Resp: 18 19    Last Pain:  Filed Vitals:   10/11/15 1444  PainSc: 2       Patients Stated Pain Goal: 0 (10/11/15 1331)  Complications: No apparent anesthesia complications

## 2015-10-11 NOTE — Progress Notes (Signed)
Assisted Dr. Joslin with right, ultrasound guided, interscalene  block. Side rails up, monitors on throughout procedure. See vital signs in flow sheet. Tolerated Procedure well. 

## 2015-10-11 NOTE — Interval H&P Note (Signed)
History and Physical Interval Note:  10/11/2015 1:52 PM  Margaret Clay  has presented today for surgery, with the diagnosis of RIGHT DISTAL RADIUS FRACTURE S52.571A  The various methods of treatment have been discussed with the patient and family. After consideration of risks, benefits and other options for treatment, the patient has consented to  Procedure(s) with comments: OPEN TREATMENT OF RIGHT DISTAL RADIUS FRACTURE (Right) - GENERAL ANESTHESIA WITH PRE-OP BLOCK as a surgical intervention .  The patient's history has been reviewed, patient examined, no change in status, stable for surgery.  I have reviewed the patient's chart and labs.  Questions were answered to the patient's satisfaction.     Philippe Gang A.

## 2015-10-12 ENCOUNTER — Encounter (HOSPITAL_BASED_OUTPATIENT_CLINIC_OR_DEPARTMENT_OTHER): Payer: Self-pay | Admitting: Orthopedic Surgery

## 2015-11-01 ENCOUNTER — Other Ambulatory Visit: Payer: Self-pay | Admitting: Orthopedic Surgery

## 2015-11-01 DIAGNOSIS — M25531 Pain in right wrist: Secondary | ICD-10-CM

## 2015-11-02 ENCOUNTER — Other Ambulatory Visit: Payer: Self-pay | Admitting: Orthopedic Surgery

## 2015-11-02 ENCOUNTER — Ambulatory Visit
Admission: RE | Admit: 2015-11-02 | Discharge: 2015-11-02 | Disposition: A | Discharge status: Home / Self Care | Payer: BLUE CROSS/BLUE SHIELD | Source: Ambulatory Visit | Attending: Orthopedic Surgery | Admitting: Orthopedic Surgery

## 2015-11-02 DIAGNOSIS — M25521 Pain in right elbow: Secondary | ICD-10-CM

## 2015-11-02 DIAGNOSIS — M25531 Pain in right wrist: Secondary | ICD-10-CM

## 2015-11-03 ENCOUNTER — Other Ambulatory Visit: Payer: BLUE CROSS/BLUE SHIELD

## 2016-04-23 NOTE — L&D Delivery Note (Signed)
Delivery Note At  a viable female was delivered via  (Presentation:OA ;  ).  APGAR:8 ,9 ; weight pending  .   Placenta status:complete , . 3v Cord:  with the following complications:nuchal cord x 2 .    Anesthesia:  None Episiotomy:  None Lacerations:  None Suture Repair: N/A Est. Blood Loss (mL):  100cc  Mom to postpartum.  Baby to Couplet care / Skin to Skin.  Margaret Clay 04/20/2017, 6:12 PM

## 2016-09-07 LAB — OB RESULTS CONSOLE GC/CHLAMYDIA
Chlamydia: NEGATIVE
Gonorrhea: NEGATIVE

## 2016-09-07 LAB — OB RESULTS CONSOLE RUBELLA ANTIBODY, IGM: Rubella: IMMUNE

## 2016-09-07 LAB — OB RESULTS CONSOLE HEPATITIS B SURFACE ANTIGEN: HEP B S AG: NEGATIVE

## 2016-09-07 LAB — OB RESULTS CONSOLE ANTIBODY SCREEN: ANTIBODY SCREEN: NEGATIVE

## 2016-09-07 LAB — OB RESULTS CONSOLE RPR: RPR: NONREACTIVE

## 2016-09-07 LAB — OB RESULTS CONSOLE HIV ANTIBODY (ROUTINE TESTING): HIV: NONREACTIVE

## 2016-09-07 LAB — OB RESULTS CONSOLE ABO/RH: RH TYPE: POSITIVE

## 2016-09-25 DIAGNOSIS — Z3490 Encounter for supervision of normal pregnancy, unspecified, unspecified trimester: Secondary | ICD-10-CM | POA: Insufficient documentation

## 2017-01-12 ENCOUNTER — Inpatient Hospital Stay (HOSPITAL_COMMUNITY): Payer: BLUE CROSS/BLUE SHIELD

## 2017-01-12 ENCOUNTER — Encounter (HOSPITAL_COMMUNITY): Payer: Self-pay | Admitting: *Deleted

## 2017-01-12 ENCOUNTER — Inpatient Hospital Stay (HOSPITAL_COMMUNITY)
Admission: AD | Admit: 2017-01-12 | Discharge: 2017-01-14 | DRG: 781 | Disposition: A | Discharge status: Home / Self Care | Payer: BLUE CROSS/BLUE SHIELD | Source: Ambulatory Visit | Attending: Obstetrics and Gynecology | Admitting: Obstetrics and Gynecology

## 2017-01-12 DIAGNOSIS — Z3A26 26 weeks gestation of pregnancy: Secondary | ICD-10-CM

## 2017-01-12 DIAGNOSIS — O09522 Supervision of elderly multigravida, second trimester: Secondary | ICD-10-CM | POA: Diagnosis not present

## 2017-01-12 DIAGNOSIS — N209 Urinary calculus, unspecified: Secondary | ICD-10-CM | POA: Diagnosis present

## 2017-01-12 DIAGNOSIS — Z87442 Personal history of urinary calculi: Secondary | ICD-10-CM | POA: Diagnosis not present

## 2017-01-12 DIAGNOSIS — O24415 Gestational diabetes mellitus in pregnancy, controlled by oral hypoglycemic drugs: Secondary | ICD-10-CM | POA: Diagnosis present

## 2017-01-12 DIAGNOSIS — O2302 Infections of kidney in pregnancy, second trimester: Secondary | ICD-10-CM | POA: Diagnosis present

## 2017-01-12 DIAGNOSIS — Z8759 Personal history of other complications of pregnancy, childbirth and the puerperium: Secondary | ICD-10-CM

## 2017-01-12 DIAGNOSIS — R109 Unspecified abdominal pain: Secondary | ICD-10-CM

## 2017-01-12 LAB — URINALYSIS, ROUTINE W REFLEX MICROSCOPIC
BILIRUBIN URINE: NEGATIVE
GLUCOSE, UA: NEGATIVE mg/dL
KETONES UR: NEGATIVE mg/dL
Nitrite: NEGATIVE
PH: 6 (ref 5.0–8.0)
Protein, ur: NEGATIVE mg/dL
Specific Gravity, Urine: 1.01 (ref 1.005–1.030)

## 2017-01-12 LAB — CBC WITH DIFFERENTIAL/PLATELET
BASOS ABS: 0 10*3/uL (ref 0.0–0.1)
BASOS PCT: 0 %
EOS ABS: 0.2 10*3/uL (ref 0.0–0.7)
Eosinophils Relative: 1 %
HEMATOCRIT: 36.6 % (ref 36.0–46.0)
Hemoglobin: 12.7 g/dL (ref 12.0–15.0)
Lymphocytes Relative: 13 %
Lymphs Abs: 1.7 10*3/uL (ref 0.7–4.0)
MCH: 30.2 pg (ref 26.0–34.0)
MCHC: 34.7 g/dL (ref 30.0–36.0)
MCV: 87.1 fL (ref 78.0–100.0)
MONO ABS: 0.3 10*3/uL (ref 0.1–1.0)
MONOS PCT: 3 %
NEUTROS ABS: 11.5 10*3/uL — AB (ref 1.7–7.7)
Neutrophils Relative %: 83 %
PLATELETS: 216 10*3/uL (ref 150–400)
RBC: 4.2 MIL/uL (ref 3.87–5.11)
RDW: 13.1 % (ref 11.5–15.5)
WBC: 13.7 10*3/uL — ABNORMAL HIGH (ref 4.0–10.5)

## 2017-01-12 LAB — BASIC METABOLIC PANEL WITH GFR
Anion gap: 10 (ref 5–15)
BUN: 9 mg/dL (ref 6–20)
CO2: 23 mmol/L (ref 22–32)
Calcium: 8.4 mg/dL — ABNORMAL LOW (ref 8.9–10.3)
Chloride: 103 mmol/L (ref 101–111)
Creatinine, Ser: 0.68 mg/dL (ref 0.44–1.00)
GFR calc Af Amer: >60 mL/min
GFR calc non Af Amer: >60 mL/min
Glucose, Bld: 122 mg/dL — ABNORMAL HIGH (ref 65–99)
Potassium: 3.4 mmol/L — ABNORMAL LOW (ref 3.5–5.1)
Sodium: 136 mmol/L (ref 135–145)

## 2017-01-12 LAB — TYPE AND SCREEN
ABO/RH(D): O POS
Antibody Screen: NEGATIVE

## 2017-01-12 LAB — GLUCOSE, CAPILLARY
GLUCOSE-CAPILLARY: 109 mg/dL — AB (ref 65–99)
GLUCOSE-CAPILLARY: 134 mg/dL — AB (ref 65–99)

## 2017-01-12 MED ORDER — METFORMIN HCL 500 MG PO TABS
500.0000 mg | ORAL_TABLET | Freq: Every day | ORAL | Status: DC
  Administered 2017-01-13: 500 mg via ORAL
  Filled 2017-01-12 (×2): qty 1

## 2017-01-12 MED ORDER — HYDROMORPHONE HCL 1 MG/ML IJ SOLN
1.0000 mg | Freq: Once | INTRAMUSCULAR | Status: AC
  Administered 2017-01-12: 1 mg via INTRAVENOUS

## 2017-01-12 MED ORDER — HYDROMORPHONE HCL 1 MG/ML IJ SOLN
INTRAMUSCULAR | Status: AC
  Filled 2017-01-12: qty 1

## 2017-01-12 MED ORDER — HYDROMORPHONE HCL 1 MG/ML IJ SOLN
1.0000 mg | Freq: Once | INTRAMUSCULAR | Status: AC
  Administered 2017-01-12: 1 mg via INTRAVENOUS
  Filled 2017-01-12: qty 1

## 2017-01-12 MED ORDER — DIPHENHYDRAMINE HCL 12.5 MG/5ML PO ELIX: 12.5000 mg | ORAL_SOLUTION | Freq: Four times a day (QID) | ORAL | Status: DC | PRN

## 2017-01-12 MED ORDER — HYDROMORPHONE 1 MG/ML IV SOLN
INTRAVENOUS | Status: DC
  Administered 2017-01-12: 0.3 mg via INTRAVENOUS
  Administered 2017-01-12: 0.4 mg via INTRAVENOUS
  Administered 2017-01-12: 2.7 mg via INTRAVENOUS
  Administered 2017-01-12: 10:00:00 via INTRAVENOUS
  Administered 2017-01-13: 0.6 mg via INTRAVENOUS
  Administered 2017-01-13 (×2): 0.2 mg via INTRAVENOUS
  Administered 2017-01-13: 0.1999 mg via INTRAVENOUS
  Administered 2017-01-13: 0.2 mg via INTRAVENOUS
  Administered 2017-01-13: 0 mg via INTRAVENOUS
  Administered 2017-01-14: 0.4 mg via INTRAVENOUS
  Administered 2017-01-14: 0.2 mg via INTRAVENOUS
  Filled 2017-01-12: qty 25

## 2017-01-12 MED ORDER — TAMSULOSIN HCL 0.4 MG PO CAPS
0.4000 mg | ORAL_CAPSULE | Freq: Every morning | ORAL | Status: DC
  Administered 2017-01-12 – 2017-01-13 (×2): 0.4 mg via ORAL
  Filled 2017-01-12 (×3): qty 1

## 2017-01-12 MED ORDER — ONDANSETRON HCL 4 MG/2ML IJ SOLN
4.0000 mg | Freq: Once | INTRAMUSCULAR | Status: AC
  Administered 2017-01-12: 4 mg via INTRAVENOUS
  Filled 2017-01-12: qty 2

## 2017-01-12 MED ORDER — CALCIUM CARBONATE ANTACID 500 MG PO CHEW: 2.0000 | CHEWABLE_TABLET | ORAL | Status: DC | PRN

## 2017-01-12 MED ORDER — LACTATED RINGERS IV BOLUS (SEPSIS)
1000.0000 mL | Freq: Once | INTRAVENOUS | Status: AC
  Administered 2017-01-12: 1000 mL via INTRAVENOUS

## 2017-01-12 MED ORDER — DIPHENHYDRAMINE HCL 50 MG/ML IJ SOLN: 12.5000 mg | Freq: Four times a day (QID) | INTRAMUSCULAR | Status: DC | PRN

## 2017-01-12 MED ORDER — ACETAMINOPHEN 325 MG PO TABS
650.0000 mg | ORAL_TABLET | ORAL | Status: DC | PRN
  Administered 2017-01-12 – 2017-01-14 (×10): 650 mg via ORAL
  Filled 2017-01-12 (×10): qty 2

## 2017-01-12 MED ORDER — ONDANSETRON HCL 4 MG/2ML IJ SOLN
4.0000 mg | Freq: Four times a day (QID) | INTRAMUSCULAR | Status: DC | PRN
  Administered 2017-01-13: 4 mg via INTRAVENOUS
  Filled 2017-01-12: qty 2

## 2017-01-12 MED ORDER — SODIUM CHLORIDE 0.9% FLUSH: 9.0000 mL | INTRAVENOUS | Status: DC | PRN

## 2017-01-12 MED ORDER — ZOLPIDEM TARTRATE 5 MG PO TABS: 5.0000 mg | ORAL_TABLET | Freq: Every evening | ORAL | Status: DC | PRN

## 2017-01-12 MED ORDER — METFORMIN HCL 500 MG PO TABS
1000.0000 mg | ORAL_TABLET | Freq: Every day | ORAL | Status: DC
  Administered 2017-01-12 – 2017-01-13 (×2): 1000 mg via ORAL
  Filled 2017-01-12 (×2): qty 2

## 2017-01-12 MED ORDER — PRENATAL MULTIVITAMIN CH
1.0000 | ORAL_TABLET | Freq: Every day | ORAL | Status: DC
  Administered 2017-01-12 – 2017-01-13 (×2): 1 via ORAL
  Filled 2017-01-12 (×2): qty 1

## 2017-01-12 MED ORDER — NALOXONE HCL 0.4 MG/ML IJ SOLN: 0.4000 mg | INTRAMUSCULAR | Status: DC | PRN

## 2017-01-12 MED ORDER — DOCUSATE SODIUM 100 MG PO CAPS
100.0000 mg | ORAL_CAPSULE | Freq: Every day | ORAL | Status: DC
  Administered 2017-01-12 – 2017-01-13 (×2): 100 mg via ORAL
  Filled 2017-01-12 (×2): qty 1

## 2017-01-12 MED ORDER — DEXTROSE 5 % IV SOLN
1.0000 g | Freq: Two times a day (BID) | INTRAVENOUS | Status: DC
  Administered 2017-01-12 – 2017-01-13 (×4): 1 g via INTRAVENOUS
  Filled 2017-01-12 (×5): qty 1

## 2017-01-12 MED ORDER — LACTATED RINGERS IV SOLN
INTRAVENOUS | Status: DC
  Administered 2017-01-12 – 2017-01-13 (×3): via INTRAVENOUS
  Administered 2017-01-13: 200 mL/h via INTRAVENOUS
  Administered 2017-01-13: 11:00:00 via INTRAVENOUS
  Administered 2017-01-13 (×2): 200 mL/h via INTRAVENOUS
  Administered 2017-01-14: 04:00:00 via INTRAVENOUS

## 2017-01-12 NOTE — MAU Provider Note (Signed)
History    Margaret Clay is a 41y.o. [id number] at 25.6wks who presents after phone call for left side flank pain and vomiting.  Patient said pain started at 0400, but that she was experiencing back pain prior to going to bed.  However, patient denies problems with urination or other GI concerns.  Patient also denies vaginal bleeding, LOF, and contractions while reporting fetal movement.  Patient with a history of kidney stones in previous pregnancies and last year.    Patient Active Problem List   Diagnosis Date Noted  . Labor and delivery, indication for care 06/12/2015  . SVD (spontaneous vaginal delivery) 06/12/2015  . GDM, class A2 06/12/2015  . Vaginal delivery 12/27/2012  . AMA (advanced maternal age) multigravida 35+ 06/09/2012  . Preterm delivery 06/09/2012  . Cervical tear resulting from childbirth 06/09/2012  . Kidney stone 06/09/2012  . Hx of PTL (preterm labor) with 3rd pregnancy, term delivery 06/09/2012  . Migraines during pregnancy 06/09/2012  . Rapid first stage of labor 06/09/2012  . Hx fractured coccyx with 3rd baby 06/09/2012  . Missed abortion 2013 09/29/2011    Chief Complaint  Patient presents with  . Nephrolithiasis   HPI  OB History    Gravida Para Term Preterm AB Living   SAB TAB Ectopic Multiple Live Births   1     0 6      Obstetric Comments   1ST BABY IN NICU 4 DAYS      Past Medical History:  Diagnosis Date  . Distal radius fracture, right 10/01/2015  . Headache(784.0)    1 x/week - not specified  . History of kidney stones   . PONV (postoperative nausea and vomiting)    after T & A as a child    Past Surgical History:  Procedure Laterality Date  . CERVIX SURGERY  2003   repair arterial tear  . OPEN REDUCTION INTERNAL FIXATION (ORIF) DISTAL RADIAL FRACTURE Right 10/11/2015   Procedure: OPEN TREATMENT OF RIGHT DISTAL RADIUS FRACTURE;  Surgeon: Mack Hook, MD;  Location: [facility];  Service: Orthopedics;   Laterality: Right;  . TONSILLECTOMY AND ADENOIDECTOMY     age 24  . WISDOM TOOTH EXTRACTION     AGE 27    Family History  Problem Relation Age of Onset  . Rheum arthritis Mother   . Stroke Mother   . Hypertension Father   . Stroke Father   . Stroke Paternal Grandfather   . Breast cancer Maternal Grandmother   . Diabetes Maternal Grandmother   . Stroke Maternal Grandmother   . Heart disease Paternal Grandfather   . Arthritis Maternal Grandmother   . Bone cancer Paternal Grandfather   . Lymphoma Paternal Grandmother     Social History  Substance Use Topics  . Smoking status: Never Smoker  . Smokeless tobacco: Never Used  . Alcohol use No    Allergies: No Known Allergies  Prescriptions Prior to Admission  Medication Sig Dispense Refill Last Dose  . HYDROmorphone (DILAUDID) 2 MG tablet Take 1 tablet (2 mg total) by mouth every 4 (four) hours as needed for severe pain (for breakthru pain). 40 tablet 0   . ibuprofen (ADVIL,MOTRIN) 400 MG tablet Take 400 mg by mouth every 6 (six) hours as needed.   10/10/2015 at Unknown time  . oxyCODONE-acetaminophen (PERCOCET/ROXICET) 5-325 MG tablet Take 1-2 tablets by mouth every 4 (four) hours as needed for moderate pain or severe pain.  80 tablet 0     ROS  See HPI Above Physical Exam   currently breastfeeding.  No results found for this or any previous visit (from the past 24 hour(s)).  Physical Exam  Constitutional: She is oriented to person, place, and time. She appears well-developed and well-nourished. She appears distressed.  HENT:  Head: Normocephalic and atraumatic.  Eyes: Conjunctivae are normal.  Neck: Normal range of motion.  Cardiovascular: Normal rate, regular rhythm and normal heart sounds.   Respiratory: Effort normal and breath sounds normal.  GI: Soft. Bowel sounds are normal. There is CVA tenderness (Left Side).  Gravid--fundal height appears AGA, Soft, NT  Musculoskeletal: Normal range of motion. She exhibits  no edema.  Neurological: She is alert and oriented to person, place, and time.  Skin: Skin is warm and dry.     FHR: 155 bpm, Mod Var, -Decels, +Accels UC: None ED Course  Assessment: IUP at 25.6wks Reassuring FT Left Side Flank Pain N/V  Plan: -Labs: UA, UC, CBC with Diff,  -Insert IV and Start LR bolus-1057mL over 2hrs -Give Zofran  for  -Renal US  -Give IV Dilaudid  once -Will update Dr. Dorris Carnes. [place]  Cherre Robins CNM, MSN 01/12/2017 6:41 AM

## 2017-01-12 NOTE — MAU Note (Signed)
FHR 150s patient insists on sitting straight up unable to trace fhr due to patient shaking and position.

## 2017-01-12 NOTE — MAU Note (Signed)
Dr. Normand Sloop notified of lab and renal ultrasound results, received order for BMET, called lab added on.

## 2017-01-12 NOTE — MAU Note (Signed)
PT SAYS  LAST NIGHT UPPER BACK  WAS HURTING.   THEN AT 0400- HAD SHARP PAIN IN LEFT  LOWER BACK AND  VOMITING.  HAS HX  OF  KIDNEY STONES    NO KIDNEY  DR.   [facility]  WITH   CCOB

## 2017-01-12 NOTE — MAU Note (Signed)
L flank pain since 0400. Hx kidney stones. N/V

## 2017-01-13 LAB — CBC
HEMATOCRIT: 24.6 % — AB (ref 36.0–46.0)
HEMOGLOBIN: 8.3 g/dL — AB (ref 12.0–15.0)
MCH: 29.5 pg (ref 26.0–34.0)
MCHC: 33.7 g/dL (ref 30.0–36.0)
MCV: 87.5 fL (ref 78.0–100.0)
Platelets: 110 10*3/uL — ABNORMAL LOW (ref 150–400)
RBC: 2.81 MIL/uL — ABNORMAL LOW (ref 3.87–5.11)
RDW: 13.4 % (ref 11.5–15.5)
WBC: 17.7 10*3/uL — AB (ref 4.0–10.5)

## 2017-01-13 LAB — GLUCOSE, CAPILLARY
GLUCOSE-CAPILLARY: 85 mg/dL (ref 65–99)
GLUCOSE-CAPILLARY: 92 mg/dL (ref 65–99)
GLUCOSE-CAPILLARY: 106 mg/dL — AB (ref 65–99)
Glucose-Capillary: 94 mg/dL (ref 65–99)

## 2017-01-13 NOTE — Progress Notes (Signed)
Dr. Normand Sloop notified of patient passing a stone. Lab analysis ordered for stone and specimen sent.

## 2017-01-13 NOTE — Plan of Care (Signed)
Problem: Education: Goal: Knowledge of [facility] Education information/materials will improve Outcome: Progressing Pt demonstrates knowledge of general health measures pertaining to this hospitalization.  Problem: Safety: Goal: Ability to remain free from injury will improve Outcome: Progressing Pt aware of need to call for help to get up to BR to avoid injury due to equipment and effects of medication.  Problem: Pain Managment: Goal: General experience of comfort will improve Outcome: Progressing Pt using PCA for pain control. Encouraged to use when her pain level is 3-4 and not let her pain get "out of control".  Problem: Physical Regulation: Goal: Ability to maintain clinical measurements within normal limits will improve Outcome: Progressing Pt has low blood pressures. She has been assisted to BR to avoid injury. She is also reminded to move slowly when getting up from lying or sitting to prevent fall or fainting.

## 2017-01-13 NOTE — Progress Notes (Signed)
Pt without complaints.  No leakage of fluid or VB.  Good FM  BP (!) 84/36 (BP Location: Right Arm)   Pulse (!) 102   Temp 98.6 F (37 C) (Oral)   Resp (!) 21   Ht '4\' 9"'  (1.448 m)   Wt 67.1 kg (148 lb)   SpO2 98%   BMI 32.03 kg/m   FHTS 163  Toco none  Pt in NAD CV RRR Lungs CTAB abd  Gravid soft and NT GU no vb EXt no calf tenderness Results for orders placed or performed during the hospital encounter of 01/12/17 (from the past 72 hour(s))  CBC with Differential/Platelet     Status: Abnormal   Collection Time: 01/12/17  6:55 AM  Result Value Ref Range   WBC 13.7 (H) 4.0 - 10.5 K/uL   RBC 4.20 3.87 - 5.11 MIL/uL   Hemoglobin 12.7 12.0 - 15.0 g/dL   HCT 36.6 36.0 - 46.0 %   MCV 87.1 78.0 - 100.0 fL   MCH 30.2 26.0 - 34.0 pg   MCHC 34.7 30.0 - 36.0 g/dL   RDW 13.1 11.5 - 15.5 %   Platelets 216 150 - 400 K/uL   Neutrophils Relative % 83 %   Neutro Abs 11.5 (H) 1.7 - 7.7 K/uL   Lymphocytes Relative 13 %   Lymphs Abs 1.7 0.7 - 4.0 K/uL   Monocytes Relative 3 %   Monocytes Absolute 0.3 0.1 - 1.0 K/uL   Eosinophils Relative 1 %   Eosinophils Absolute 0.2 0.0 - 0.7 K/uL   Basophils Relative 0 %   Basophils Absolute 0.0 0.0 - 0.1 K/uL  Basic metabolic panel     Status: Abnormal   Collection Time: 01/12/17  6:55 AM  Result Value Ref Range   Sodium 136 135 - 145 mmol/L   Potassium 3.4 (L) 3.5 - 5.1 mmol/L   Chloride 103 101 - 111 mmol/L   CO2 23 22 - 32 mmol/L   Glucose, Bld 122 (H) 65 - 99 mg/dL   BUN 9 6 - 20 mg/dL   Creatinine, Ser 0.68 0.44 - 1.00 mg/dL   Calcium 8.4 (L) 8.9 - 10.3 mg/dL   GFR calc non Af Amer >60 >60 mL/min   GFR calc Af Amer >60 >60 mL/min    Comment: (NOTE) The eGFR has been calculated using the CKD EPI equation. This calculation has not been validated in all clinical situations. eGFR's persistently <60 mL/min signify possible Chronic Kidney Disease.    Anion gap 10 5 - 15  Urinalysis, Routine w reflex microscopic     Status: Abnormal   Collection Time: 01/12/17  8:57 AM  Result Value Ref Range   Color, Urine YELLOW YELLOW   APPearance HAZY (A) CLEAR   Specific Gravity, Urine 1.010 1.005 - 1.030   pH 6.0 5.0 - 8.0   Glucose, UA NEGATIVE NEGATIVE mg/dL   Hgb urine dipstick LARGE (A) NEGATIVE   Bilirubin Urine NEGATIVE NEGATIVE   Ketones, ur NEGATIVE NEGATIVE mg/dL   Protein, ur NEGATIVE NEGATIVE mg/dL   Nitrite NEGATIVE NEGATIVE   Leukocytes, UA LARGE (A) NEGATIVE   RBC / HPF TOO NUMEROUS TO COUNT 0 - 5 RBC/hpf   WBC, UA TOO NUMEROUS TO COUNT 0 - 5 WBC/hpf   Bacteria, UA RARE (A) NONE SEEN   Squamous Epithelial / LPF 6-30 (A) NONE SEEN   Mucus PRESENT   Type and screen [place]     Status: None   Collection Time:  01/12/17  9:20 AM  Result Value Ref Range   ABO/RH(D) O POS    Antibody Screen NEG    Sample Expiration 01/15/2017   Glucose, capillary     Status: Abnormal   Collection Time: 01/12/17  6:37 PM  Result Value Ref Range   Glucose-Capillary 109 (H) 65 - 99 mg/dL  Glucose, capillary     Status: Abnormal   Collection Time: 01/12/17 10:53 PM  Result Value Ref Range   Glucose-Capillary 134 (H) 65 - 99 mg/dL  CBC     Status: Abnormal   Collection Time: 01/13/17  5:33 AM  Result Value Ref Range   WBC 17.7 (H) 4.0 - 10.5 K/uL   RBC 2.81 (L) 3.87 - 5.11 MIL/uL   Hemoglobin 8.3 (L) 12.0 - 15.0 g/dL    Comment: DELTA CHECK NOTED REPEATED TO VERIFY    HCT 24.6 (L) 36.0 - 46.0 %   MCV 87.5 78.0 - 100.0 fL   MCH 29.5 26.0 - 34.0 pg   MCHC 33.7 30.0 - 36.0 g/dL   RDW 13.4 11.5 - 15.5 %   Platelets 110 (L) 150 - 400 K/uL    Comment: SPECIMEN CHECKED FOR CLOTS REPEATED TO VERIFY PLATELET COUNT CONFIRMED BY SMEAR   Glucose, capillary     Status: None   Collection Time: 01/13/17  6:26 AM  Result Value Ref Range   Glucose-Capillary 85 65 - 99 mg/dL    Assessment and Plan [id number] and Urolithiasis Pt still with pain and has not passed a stone yet Continue current  management Pt is afebrile.  Urine culture pending Continue cefotan   Patient ID: Margaret Clay female   DOB: [birth year] 41y.o.   MRN: [id number]

## 2017-01-14 LAB — GLUCOSE, CAPILLARY: GLUCOSE-CAPILLARY: 74 mg/dL (ref 65–99)

## 2017-01-14 MED ORDER — HYDROMORPHONE HCL 2 MG PO TABS: 2.0000 mg | ORAL_TABLET | Freq: Two times a day (BID) | ORAL | 0 refills | Status: DC | PRN

## 2017-01-14 MED ORDER — CEPHALEXIN 500 MG PO CAPS: 500.0000 mg | ORAL_CAPSULE | Freq: Four times a day (QID) | ORAL | 0 refills | Status: AC

## 2017-01-14 NOTE — Discharge Instructions (Signed)

## 2017-01-14 NOTE — Discharge Summary (Signed)
Physician Discharge Summary  Patient ID: Margaret Clay MRN: [id number] DOB/AGE: Feb 21, 1976 41 y.o.  Admit date: 01/12/2017 Discharge date: 01/14/2017  Admission Diagnoses: Left Side Flank Pain  Discharge Diagnoses:  Active Problems:   Urolithiasis   Discharged Condition: Improved  Hospital Course: [place], IV antibiotics, PCA  Consults: None  Significant Diagnostic Studies: labs: CBC, UA, UC  Treatments: IV hydration, antibiotics: Cefotan   Discharge Exam: Blood pressure (!) 105/59, pulse (!) 103, temperature 99 F (37.2 C), temperature source Oral, resp. rate 18, height  (1.448 m), weight 67.1 kg (148 lb), SpO2 96 %, currently breastfeeding. General appearance: alert, cooperative and mild distress Head: Normocephalic, without obvious abnormality, atraumatic Resp: clear to auscultation bilaterally Cardio: regular rate and rhythm GI: normal findings: bowel sounds normal and soft, non-tender Extremities: extremities normal, atraumatic, no cyanosis or edema Pulses: 2+ and symmetric Skin: Skin color, texture, turgor normal. No rashes or lesions  Disposition: 01-Home or Self Care  Discharge Instructions    Discharge instructions    Complete by:  As directed    As Discussed   Notify physician for a general feeling that "something is not right"    Complete by:  As directed    Notify physician for increase or change in vaginal discharge    Complete by:  As directed    Notify physician for intestinal cramps, with or without diarrhea, sometimes described as "gas pain"    Complete by:  As directed    Notify physician for leaking of fluid    Complete by:  As directed    Notify physician for low, dull backache, unrelieved by heat or Tylenol    Complete by:  As directed    Notify physician for menstrual like cramps    Complete by:  As directed    Notify physician for pelvic pressure    Complete by:  As directed    Notify physician for uterine contractions.  These may be  painless and feel like the uterus is tightening or the baby is  "balling up"    Complete by:  As directed    Notify physician for vaginal bleeding    Complete by:  As directed    PRETERM LABOR:  Includes any of the follwing symptoms that occur between 20 - [redacted] weeks gestation.  If these symptoms are not stopped, preterm labor can result in preterm delivery, placing your baby at risk    Complete by:  As directed      Allergies as of 01/14/2017   No Known Allergies     Medication List    TAKE these medications   acetaminophen 500 MG tablet Commonly known as:  TYLENOL Take 1,000 mg by mouth every 6 (six) hours as needed for mild pain or headache.   aluminum hydroxide-magnesium carbonate 95-358 MG/15ML Susp Commonly known as:  GAVISCON Take 15 mLs by mouth daily as needed for indigestion or heartburn.   BONJESTA 20-20 MG Tbcr Generic drug:  Doxylamine-Pyridoxine ER Take 1 tablet by mouth 2 (two) times daily as needed (nausea and vomiting).   cephALEXin 500 MG capsule Commonly known as:  KEFLEX Take 1 capsule (500 mg total) by mouth 4 (four) times daily.   HYDROmorphone 2 MG tablet Commonly known as:  DILAUDID Take 1 tablet (2 mg total) by mouth every 12 (twelve) hours as needed for severe pain.   metFORMIN 500 MG tablet Commonly known as:  GLUCOPHAGE Take 500 mg by mouth daily with breakfast.   metFORMIN 1000 MG  tablet Commonly known as:  GLUCOPHAGE Take 1,000 mg by mouth Nightly.   prenatal multivitamin Tabs tablet Take 1 tablet by mouth daily at 12 noon.            Discharge Care Instructions        Start     Ordered   01/14/17 0000  cephALEXin (KEFLEX) 500 MG capsule  4 times daily     01/14/17 0444   01/14/17 0000  PRETERM LABOR:  Includes any of the follwing symptoms that occur between 20 - [redacted] weeks gestation.  If these symptoms are not stopped, preterm labor can result in preterm delivery, placing your baby at risk  (Preterm Labor Notify MD)     01/14/17  0444   01/14/17 0000  Notify physician for menstrual like cramps  (Preterm Labor Notify MD)     01/14/17 0444   01/14/17 0000  Notify physician for uterine contractions.  These may be painless and feel like the uterus is tightening or the baby is  "balling up"  (Preterm Labor Notify MD)     01/14/17 0444   01/14/17 0000  Notify physician for low, dull backache, unrelieved by heat or Tylenol  (Preterm Labor Notify MD)     01/14/17 0444   01/14/17 0000  Notify physician for intestinal cramps, with or without diarrhea, sometimes described as "gas pain"  (Preterm Labor Notify MD)     01/14/17 0444   01/14/17 0000  Notify physician for pelvic pressure  (Preterm Labor Notify MD)     01/14/17 0444   01/14/17 0000  Notify physician for increase or change in vaginal discharge  (Preterm Labor Notify MD)     01/14/17 0444   01/14/17 0000  Notify physician for vaginal bleeding  (Preterm Labor Notify MD)     01/14/17 0444   01/14/17 0000  Notify physician for a general feeling that "something is not right"  (Preterm Labor Notify MD)     01/14/17 0444   01/14/17 0000  Notify physician for leaking of fluid  (Preterm Labor Notify MD)     01/14/17 0444   01/14/17 0000  Discharge instructions    Comments:  As Discussed   01/14/17 0444   01/14/17 0000  HYDROmorphone (DILAUDID) 2 MG tablet  Every 12 hours PRN     01/14/17 0444     Follow-up Information    [facility] Follow up.   Specialty:  Obstetrics and Gynecology Why:  Call if you have any questions or concerns prior to your next visit.  Contact information: [address] [place] [address] [phone]          Signed: Cherre Robins MSN, CNM 01/14/2017, 4:47 AM

## 2017-01-15 LAB — CULTURE, OB URINE: Culture: >=100000 — AB

## 2017-01-17 LAB — STONE ANALYSIS
CA OXALATE, MONOHYDR.: 25 %
CA PHOS CRY STONE QL IR: 70 %
Ca Oxalate,Dihydrate: 5 %
STONE WEIGHT KSTONE: 53.9 mg

## 2017-03-18 LAB — OB RESULTS CONSOLE GBS: GBS: NEGATIVE

## 2017-04-08 ENCOUNTER — Telehealth (HOSPITAL_COMMUNITY): Payer: Self-pay | Admitting: *Deleted

## 2017-04-08 ENCOUNTER — Encounter (HOSPITAL_COMMUNITY): Payer: Self-pay | Admitting: *Deleted

## 2017-04-08 NOTE — Telephone Encounter (Signed)
Preadmission screen  

## 2017-04-18 ENCOUNTER — Other Ambulatory Visit: Payer: Self-pay | Admitting: Obstetrics and Gynecology

## 2017-04-20 ENCOUNTER — Encounter (HOSPITAL_COMMUNITY): Payer: Self-pay

## 2017-04-20 ENCOUNTER — Inpatient Hospital Stay (HOSPITAL_COMMUNITY)
Admission: RE | Admit: 2017-04-20 | Discharge: 2017-04-22 | DRG: 807 | Disposition: A | Discharge status: Home / Self Care | Payer: BLUE CROSS/BLUE SHIELD | Source: Ambulatory Visit | Attending: Obstetrics and Gynecology | Admitting: Obstetrics and Gynecology

## 2017-04-20 DIAGNOSIS — O24425 Gestational diabetes mellitus in childbirth, controlled by oral hypoglycemic drugs: Secondary | ICD-10-CM | POA: Diagnosis present

## 2017-04-20 DIAGNOSIS — O24415 Gestational diabetes mellitus in pregnancy, controlled by oral hypoglycemic drugs: Secondary | ICD-10-CM | POA: Diagnosis present

## 2017-04-20 DIAGNOSIS — Z3A39 39 weeks gestation of pregnancy: Secondary | ICD-10-CM

## 2017-04-20 LAB — TYPE AND SCREEN
ABO/RH(D): O POS
ANTIBODY SCREEN: NEGATIVE

## 2017-04-20 LAB — CBC
HCT: 35.2 % — ABNORMAL LOW (ref 36.0–46.0)
Hemoglobin: 11.7 g/dL — ABNORMAL LOW (ref 12.0–15.0)
MCH: 27.9 pg (ref 26.0–34.0)
MCHC: 33.2 g/dL (ref 30.0–36.0)
MCV: 83.8 fL (ref 78.0–100.0)
PLATELETS: 210 10*3/uL (ref 150–400)
RBC: 4.2 MIL/uL (ref 3.87–5.11)
RDW: 13.4 % (ref 11.5–15.5)
WBC: 12.4 10*3/uL — AB (ref 4.0–10.5)

## 2017-04-20 LAB — GLUCOSE, CAPILLARY
GLUCOSE-CAPILLARY: 109 mg/dL — AB (ref 65–99)
Glucose-Capillary: 80 mg/dL (ref 65–99)

## 2017-04-20 MED ORDER — OXYTOCIN 40 UNITS IN LACTATED RINGERS INFUSION - SIMPLE MED
1.0000 m[IU]/min | INTRAVENOUS | Status: DC
  Filled 2017-04-20 (×2): qty 1000

## 2017-04-20 MED ORDER — TETANUS-DIPHTH-ACELL PERTUSSIS 5-2.5-18.5 LF-MCG/0.5 IM SUSP: 0.5000 mL | Freq: Once | INTRAMUSCULAR | Status: DC

## 2017-04-20 MED ORDER — COCONUT OIL OIL
1.0000 "application " | TOPICAL_OIL | Status: DC | PRN
  Administered 2017-04-21: 1 via TOPICAL
  Filled 2017-04-20: qty 120

## 2017-04-20 MED ORDER — OXYTOCIN 40 UNITS IN LACTATED RINGERS INFUSION - SIMPLE MED
1.0000 m[IU]/min | INTRAVENOUS | Status: DC
  Administered 2017-04-20: 2 m[IU]/min via INTRAVENOUS

## 2017-04-20 MED ORDER — LACTATED RINGERS IV SOLN: 500.0000 mL | INTRAVENOUS | Status: DC | PRN

## 2017-04-20 MED ORDER — OXYTOCIN 40 UNITS IN LACTATED RINGERS INFUSION - SIMPLE MED: 2.5000 [IU]/h | INTRAVENOUS | Status: DC

## 2017-04-20 MED ORDER — ZOLPIDEM TARTRATE 5 MG PO TABS: 5.0000 mg | ORAL_TABLET | Freq: Every evening | ORAL | Status: DC | PRN

## 2017-04-20 MED ORDER — ACETAMINOPHEN 325 MG PO TABS
650.0000 mg | ORAL_TABLET | ORAL | Status: DC | PRN
  Administered 2017-04-20: 650 mg via ORAL
  Filled 2017-04-20: qty 2

## 2017-04-20 MED ORDER — OXYTOCIN BOLUS FROM INFUSION
500.0000 mL | Freq: Once | INTRAVENOUS | Status: AC
  Administered 2017-04-20: 500 mL via INTRAVENOUS

## 2017-04-20 MED ORDER — LIDOCAINE HCL (PF) 1 % IJ SOLN
30.0000 mL | INTRAMUSCULAR | Status: DC | PRN
  Filled 2017-04-20: qty 30

## 2017-04-20 MED ORDER — SENNOSIDES-DOCUSATE SODIUM 8.6-50 MG PO TABS
2.0000 | ORAL_TABLET | ORAL | Status: DC
  Administered 2017-04-21 (×2): 2 via ORAL
  Filled 2017-04-20 (×2): qty 2

## 2017-04-20 MED ORDER — DIPHENHYDRAMINE HCL 25 MG PO CAPS: 25.0000 mg | ORAL_CAPSULE | Freq: Four times a day (QID) | ORAL | Status: DC | PRN

## 2017-04-20 MED ORDER — FENTANYL CITRATE (PF) 100 MCG/2ML IJ SOLN: 50.0000 ug | INTRAMUSCULAR | Status: DC | PRN

## 2017-04-20 MED ORDER — LACTATED RINGERS IV SOLN
INTRAVENOUS | Status: DC
  Administered 2017-04-20 (×2): via INTRAVENOUS

## 2017-04-20 MED ORDER — ONDANSETRON HCL 4 MG PO TABS: 4.0000 mg | ORAL_TABLET | ORAL | Status: DC | PRN

## 2017-04-20 MED ORDER — TERBUTALINE SULFATE 1 MG/ML IJ SOLN
0.2500 mg | Freq: Once | INTRAMUSCULAR | Status: DC | PRN
  Filled 2017-04-20: qty 1

## 2017-04-20 MED ORDER — MISOPROSTOL 25 MCG QUARTER TABLET
25.0000 ug | ORAL_TABLET | ORAL | Status: DC | PRN
  Filled 2017-04-20: qty 1

## 2017-04-20 MED ORDER — SOD CITRATE-CITRIC ACID 500-334 MG/5ML PO SOLN: 30.0000 mL | ORAL | Status: DC | PRN

## 2017-04-20 MED ORDER — BENZOCAINE-MENTHOL 20-0.5 % EX AERO: 1.0000 "application " | INHALATION_SPRAY | CUTANEOUS | Status: DC | PRN

## 2017-04-20 MED ORDER — SIMETHICONE 80 MG PO CHEW: 80.0000 mg | CHEWABLE_TABLET | ORAL | Status: DC | PRN

## 2017-04-20 MED ORDER — IBUPROFEN 600 MG PO TABS
600.0000 mg | ORAL_TABLET | Freq: Four times a day (QID) | ORAL | Status: DC
  Administered 2017-04-20 – 2017-04-22 (×8): 600 mg via ORAL
  Filled 2017-04-20 (×8): qty 1

## 2017-04-20 MED ORDER — FLEET ENEMA 7-19 GM/118ML RE ENEM: 1.0000 | ENEMA | RECTAL | Status: DC | PRN

## 2017-04-20 MED ORDER — DIBUCAINE 1 % RE OINT: 1.0000 "application " | TOPICAL_OINTMENT | RECTAL | Status: DC | PRN

## 2017-04-20 MED ORDER — ONDANSETRON HCL 4 MG/2ML IJ SOLN: 4.0000 mg | Freq: Four times a day (QID) | INTRAMUSCULAR | Status: DC | PRN

## 2017-04-20 MED ORDER — ONDANSETRON HCL 4 MG/2ML IJ SOLN: 4.0000 mg | INTRAMUSCULAR | Status: DC | PRN

## 2017-04-20 MED ORDER — PRENATAL MULTIVITAMIN CH
1.0000 | ORAL_TABLET | Freq: Every day | ORAL | Status: DC
  Administered 2017-04-21 – 2017-04-22 (×2): 1 via ORAL
  Filled 2017-04-20 (×2): qty 1

## 2017-04-20 MED ORDER — WITCH HAZEL-GLYCERIN EX PADS
1.0000 "application " | MEDICATED_PAD | CUTANEOUS | Status: DC | PRN
  Administered 2017-04-21: 1 via TOPICAL

## 2017-04-20 NOTE — H&P (Signed)
Margaret Clay is a 41 y.o. female, [phone] at 39.6 weeks, presenting for induction for GDMA2 and AMA.  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  During this pregnancy BS controlled with Metformin 500mg  in am and 100mg  pm.  Denies problems or concerns today.  FM+  FBS 99.  Patient Active Problem List   Diagnosis Date Noted  . Gestational diabetes mellitus (GDM) in third trimester controlled on oral hypoglycemic drug 04/20/2017  . Urolithiasis 01/12/2017  . Labor and delivery, indication for care 06/12/2015  . SVD (spontaneous vaginal delivery) 06/12/2015  . GDM, class A2 06/12/2015  . Vaginal delivery 12/27/2012  . AMA (advanced maternal age) multigravida 35+ 06/09/2012  . Preterm delivery 06/09/2012  . Cervical tear resulting from childbirth 06/09/2012  . Kidney stone 06/09/2012  . Hx of PTL (preterm labor) with 3rd pregnancy, term delivery 06/09/2012  . Migraines during pregnancy 06/09/2012  . Rapid first stage of labor 06/09/2012  . Hx fractured coccyx with 3rd baby 06/09/2012  . Missed abortion 2013 09/29/2011    History of present pregnancy: Patient entered care at 10 weeks.   EDC of 12/30/20118 was established by LMP/US.   Anatomy scan: 20  weeks, with normal findings and an anterior placenta.   Additional US evaluations:  For GDM  Last US at 39.4 70%. AFI wnl, anterior placenta BPP 8/8 Significant prenatal events:  GDMA2 Last evaluation:  2 days ago  OB History    Gravida Para Term Preterm AB Living   9 6 5 1 1 6    SAB TAB Ectopic Multiple Live Births   1     0 6      Obstetric Comments   1ST BABY IN NICU 6754 DAYS     Past Medical History:  Diagnosis Date  . Anemia   . Distal radius fracture, right 10/01/2015  . Headache(784.0)    1 x/week - not specified  . History of fractured pelvis   . History of gestational diabetes   . History of kidney  stones   . History of postpartum hemorrhage, currently pregnant   . Injury of coccyx   . PONV (postoperative nausea and vomiting)    after T & A as a child   Past Surgical History:  Procedure Laterality Date  . CERVIX SURGERY  2003   repair arterial tear  . OPEN REDUCTION INTERNAL FIXATION (ORIF) DISTAL RADIAL FRACTURE Right 10/11/2015   Procedure: OPEN TREATMENT OF RIGHT DISTAL RADIUS FRACTURE;  Surgeon: Mack Hookavid Thompson, MD;  Location: [facility];  Service: Orthopedics;  Laterality: Right;  . TONSILLECTOMY AND ADENOIDECTOMY     age [age]  . WISDOM TOOTH EXTRACTION     AGE 22   Family History: family history includes Arthritis in her maternal grandmother; Bone cancer in her paternal grandfather; Breast cancer in her maternal grandmother; Diabetes in her maternal grandmother; Heart disease in her paternal grandfather; Hypertension in her father; Lymphoma in her paternal grandmother; Rheum arthritis in her mother; Stroke in her father, maternal grandmother, mother, and paternal grandfather. Social History:  reports that  has never smoked. she has never used smokeless tobacco. She reports that she does not drink alcohol or use drugs.   Prenatal Transfer Tool  Maternal Diabetes: Yes:  Diabetes Type:  Insulin/Medication controlled Genetic Screening: Declined Maternal Ultrasounds/Referrals: Normal Fetal Ultrasounds or other Referrals:  None Maternal Substance Abuse:  No Significant  Maternal Medications:  Meds include: Other: Meformin Significant Maternal Lab Results: Lab values include: Group B Strep negative, Other:   TDAP Yes Flu Yes  ROS:  All 10 systems reviewed and negative.  No Known Allergies   Dilation: 2 Effacement (%): Thick Station: Ballotable Exam by:: Bernerd PhoNancy Jacaden Forbush CNM Blood pressure 121/83, pulse 94, temperature 97.9 F (36.6 C), temperature source Oral, resp. rate 18, currently breastfeeding.  Chest clear Heart RRR without murmur Abd gravid, NT, FH  appropriate.   Pelvic: Proven to 8-5.   Ext: +2/+2 negative edema Bedside US vertex  asynclitic FHR: Category 1 UCs:  occ  Prenatal labs: ABO, Rh: --/--/O POS (12/29 0818) Antibody: PENDING (12/29 0818) Rubella:  Immune (05/18 0000) RPR: Nonreactive (05/18 0000)  HBsAg: Negative (05/18 0000)  HIV: Non-reactive (05/18 0000)  GBS: Negative (11/26 0000) Sickle cell/Hgb electrophoresis:  N/A Pap:  2016 negative GC:  Neg Chlamydia:  Neg Genetic screenings:  Declined Glucola:  Hgb AIC 5.4 , 1hr 169 Other:   Hgb 11.9 at NOB, 11.1at 28 weeks  plt wnl       Assessment/Plan: [id number] at 39.6 IUP presents [facility] induction of [facility]. Cat 1 strip Previous hx of PPH  Plan: Admit to Birthing Suite  Routine [facility] orders Pain med/epidural prn Pitocin induction  Henderson NewcomerNancy Jean ProtheroCNM, MSN 04/20/2017, 9:02 AM

## 2017-04-20 NOTE — Anesthesia Pain Management Evaluation Note (Signed)
  CRNA Pain Management Visit Note  Patient: Margaret Clay, 41 y.o., female  "Hello I am a [facility] of the anesthesia team [facility]. We have an anesthesia team available at all times to provide care throughout the hospital, including epidural management and anesthesia for C-section. I don't know your plan for the delivery whether it a natural birth, water birth, IV sedation, nitrous supplementation, doula or epidural, but we want to meet your pain goals."   1.Was your pain managed to your expectations on prior hospitalizations?   Yes   2.What is your expectation for pain management during this hospitalization?     Labor support without medications  3.How can we help you reach that goal? Nursing support  Record the patient's initial score and the patient's pain goal.   Pain: 0/10  Pain Goal: 5/10 [facility] wants you to be able to say your pain was always managed very well.  Margaret Clay, Margaret Clay 04/20/2017

## 2017-04-21 LAB — CBC
HCT: 33 % — ABNORMAL LOW (ref 36.0–46.0)
HEMOGLOBIN: 10.8 g/dL — AB (ref 12.0–15.0)
MCH: 27.7 pg (ref 26.0–34.0)
MCHC: 32.7 g/dL (ref 30.0–36.0)
MCV: 84.6 fL (ref 78.0–100.0)
Platelets: 228 10*3/uL (ref 150–400)
RBC: 3.9 MIL/uL (ref 3.87–5.11)
RDW: 13.5 % (ref 11.5–15.5)
WBC: 10.4 10*3/uL (ref 4.0–10.5)

## 2017-04-21 LAB — GLUCOSE, CAPILLARY
GLUCOSE-CAPILLARY: 95 mg/dL (ref 65–99)
GLUCOSE-CAPILLARY: 109 mg/dL — AB (ref 65–99)

## 2017-04-21 LAB — RPR: RPR: NONREACTIVE

## 2017-04-21 MED ORDER — OXYCODONE-ACETAMINOPHEN 5-325 MG PO TABS
1.0000 | ORAL_TABLET | ORAL | Status: DC | PRN
  Administered 2017-04-21 (×2): 2 via ORAL
  Filled 2017-04-21: qty 1
  Filled 2017-04-21: qty 2
  Filled 2017-04-21: qty 1

## 2017-04-21 NOTE — Progress Notes (Signed)
Subjective: Postpartum Day 1: Vaginal delivery, no laceration  Having uterine cramping with breastfeeding.  Plans on staying till tomorrow Patient up ad lib, reports no syncope or dizziness. Feeding:  breastfeeding Contraceptive plan:  undecided  Objective: Vital signs in last 24 hours: Temp:  [97.9 F (36.6 C)-98.6 F (37 C)] 98.3 F (36.8 C) (12/30 0925) Pulse Rate:  [68-115] 72 (12/30 0925) Resp:  [16-18] 18 (12/29 1959) BP: (93-129)/(50-85) 129/84 (12/30 0925) SpO2:  [100 %] 100 % (12/29 1959) Weight:  [69.9 kg (154 lb)] 69.9 kg (154 lb) (12/29 1232)  Physical Exam:  General: alert, cooperative and mild distress Lochia: appropriate Uterine Fundus: firm Perineum: healing well, N/A DVT Evaluation: No evidence of DVT seen on physical exam. Negative Homan's sign.   CBC Latest Ref Rng & Units 04/21/2017 04/20/2017 01/13/2017  WBC 4.0 - 10.5 K/uL 10.4 12.4(H) 17.7(H)  Hemoglobin 12.0 - 15.0 g/dL 10.8(L) 11.7(L) 8.3(L)  Hematocrit 36.0 - 46.0 % 33.0(L) 35.2(L) 24.6(L)  Platelets 150 - 400 K/uL 228 210 110(L)     Assessment/Plan: Status post vaginal delivery day 1. GDMA2 Stable Continue current care. Plan for discharge tomorrow and Breastfeeding.  Checking sugars.  Fasting 95.  Will take 2 hour pp.  Pt would like to restart Metformin if BS high.  Discussed if high can do 500mg  am and monitor if need to add a pm dose.  Will check a fasting and a HgbAIC at pp appt.    Henderson Newcomerancy Jean ProtheroCNM 04/21/2017, 10:29 AM

## 2017-04-22 MED ORDER — IBUPROFEN 600 MG PO TABS
600.0000 mg | ORAL_TABLET | Freq: Four times a day (QID) | ORAL | 0 refills | Status: DC
Start: 2017-04-22 — End: 2021-05-24

## 2017-04-22 NOTE — Discharge Summary (Signed)
Obstetric Discharge Summary Reason for Admission: induction of labor GDMA2, ama Prenatal Procedures: biweekly testing Intrapartum Procedures: spontaneous vaginal delivery Postpartum Procedures: none Complications-Operative and Postpartum: none Hemoglobin  Date Value Ref Range Status  04/21/2017 10.8 (L) 12.0 - 15.0 g/dL Final   HCT  Date Value Ref Range Status  04/21/2017 33.0 (L) 36.0 - 46.0 % Final    Physical Exam:  General: alert, cooperative and appears stated age 40Lochia: appropriate Uterine Fundus: firm Incision:  DVT Evaluation: No evidence of DVT seen on physical exam.  Discharge Diagnoses: Term Pregnancy-delivered  Discharge Information: Date: 04/22/2017 Activity: pelvic rest Diet: routine Medications: PNV and Ibuprofen Condition: stable Instructions: refer to practice specific booklet Discharge to: home Follow-up Information    [facility]. Schedule an appointment as soon as possible for a visit in 6 week(s).   Specialty:  Obstetrics and Gynecology Contact information: [address] [place] [phone]          Newborn Data: Live born female  Birth Weight: 8 lb 2.7 oz (3705 g) APGAR: 9, 9  Newborn Delivery   Birth date/time:  04/20/2017 17:50:00 Delivery type:  Vaginal, Spontaneous     Home with mother.  Lori A Clemmons CNM 04/22/2017, 3:41 AM

## 2017-04-22 NOTE — Discharge Instructions (Signed)
Vaginal Delivery, Care After °Refer to this sheet in the next few weeks. These instructions provide you with information about caring for yourself after vaginal delivery. Your health care provider may also give you more specific instructions. Your treatment has been planned according to current medical practices, but problems sometimes occur. Call your health care provider if you have any problems or questions. °What can I expect after the procedure? °After vaginal delivery, it is common to have: °· Some bleeding from your vagina. °· Soreness in your abdomen, your vagina, and the area of skin between your vaginal opening and your anus (perineum). °· Pelvic cramps. °· Fatigue. ° °Follow these instructions at home: °Medicines °· Take over-the-counter and prescription medicines only as told by your health care provider. °· If you were prescribed an antibiotic medicine, take it as told by your health care provider. Do not stop taking the antibiotic until it is finished. °Driving ° °· Do not drive or operate heavy machinery while taking prescription pain medicine. °· Do not drive for 24 hours if you received a sedative. °Lifestyle °· Do not drink alcohol. This is especially important if you are breastfeeding or taking medicine to relieve pain. °· Do not use tobacco products, including cigarettes, chewing tobacco, or e-cigarettes. If you need help quitting, ask your health care provider. °Eating and drinking °· Drink at least 8 eight-ounce glasses of water every day unless you are told not to by your health care provider. If you choose to breastfeed your baby, you may need to drink more water than this. °· Eat high-fiber foods every day. These foods may help prevent or relieve constipation. High-fiber foods include: °? Whole grain cereals and breads. °? Brown rice. °? Beans. °? Fresh fruits and vegetables. °Activity °· Return to your normal activities as told by your health care provider. Ask your health care provider  what activities are safe for you. °· Rest as much as possible. Try to rest or take a nap when your baby is sleeping. °· Do not lift anything that is heavier than your baby or 10 lb (4.5 kg) until your health care provider says that it is safe. °· Talk with your health care provider about when you can engage in sexual activity. This may depend on your: °? Risk of infection. °? Rate of healing. °? Comfort and desire to engage in sexual activity. °Vaginal Care °· If you have an episiotomy or a vaginal tear, check the area every day for signs of infection. Check for: °? More redness, swelling, or pain. °? More fluid or blood. °? Warmth. °? Pus or a bad smell. °· Do not use tampons or douches until your health care provider says this is safe. °· Watch for any blood clots that may pass from your vagina. These may look like clumps of dark red, brown, or black discharge. °General instructions °· Keep your perineum clean and dry as told by your health care provider. °· Wear loose, comfortable clothing. °· Wipe from front to back when you use the toilet. °· Ask your health care provider if you can shower or take a bath. If you had an episiotomy or a perineal tear during labor and delivery, your health care provider may tell you not to take baths for a certain length of time. °· Wear a bra that supports your breasts and fits you well. °· If possible, have someone help you with household activities and help care for your baby for at least a few days after   you leave the hospital. °· Keep all follow-up visits for you and your baby as told by your health care provider. This is important. °Contact a health care provider if: °· You have: °? Vaginal discharge that has a bad smell. °? Difficulty urinating. °? Pain when urinating. °? A sudden increase or decrease in the frequency of your bowel movements. °? More redness, swelling, or pain around your episiotomy or vaginal tear. °? More fluid or blood coming from your episiotomy or  vaginal tear. °? Pus or a bad smell coming from your episiotomy or vaginal tear. °? A fever. °? A rash. °? Little or no interest in activities you used to enjoy. °? Questions about caring for yourself or your baby. °· Your episiotomy or vaginal tear feels warm to the touch. °· Your episiotomy or vaginal tear is separating or does not appear to be healing. °· Your breasts are painful, hard, or turn red. °· You feel unusually sad or worried. °· You feel nauseous or you vomit. °· You pass large blood clots from your vagina. If you pass a blood clot from your vagina, save it to show to your health care provider. Do not flush blood clots down the toilet without having your health care provider look at them. °· You urinate more than usual. °· You are dizzy or light-headed. °· You have not breastfed at all and you have not had a menstrual period for 12 weeks after delivery. °· You have stopped breastfeeding and you have not had a menstrual period for 12 weeks after you stopped breastfeeding. °Get help right away if: °· You have: °? Pain that does not go away or does not get better with medicine. °? Chest pain. °? Difficulty breathing. °? Blurred vision or spots in your vision. °? Thoughts about hurting yourself or your baby. °· You develop pain in your abdomen or in one of your legs. °· You develop a severe headache. °· You faint. °· You bleed from your vagina so much that you fill two sanitary pads in one hour. °This information is not intended to replace advice given to you by your health care provider. Make sure you discuss any questions you have with your health care provider. °Document Released: 04/06/2000 Document Revised: 09/21/2015 Document Reviewed: 04/24/2015 °Elsevier Interactive Patient Education © 2018 [employer] ° °

## 2017-04-22 NOTE — Lactation Note (Signed)
This note was copied from a baby's chart. Lactation Consultation Note  Patient Name: Girl Mathews ArgyleStephanie Fok ZOXWR'UToday's Date: 04/22/2017 Reason for consult: Initial assessment   Initial consult with mom of 5941 hour old infant. Mom is an LC and has BF 6 other children. Mom pumped last night and spoon fed infant 8 cc colostrum.   Infant was cluster feeding last night. Infant has not voided or stooled since late last evening, infant has a very large void while LC in the room.   Mom reports infant was fussy, gassy and spitty last night. Mom reports infant with some difficulty organizing suck when latching. Infant noted to have recessed chin, high palate and tongue is behind gumline when suckling on gloved finger. Infant is able to extend and lateralize tongue well, was not able to assess elevation well at this assessment. Infant with labial frenulum that inserts at the bottom of the gum ridge with some resistance to flanging upper lip.  Mom reports her nipples are getting sore, she is using EBM to nipples, she has coconut oil in the room. Mom has had 2 older children that were treated for tongue/lip ties.  Mom has no questions/concerns at this time.  Mom aware of OP services, BF Support Groups and LC phone #. Mom to call with any questions/concerns as needed. Mom has a pump for home use.    Maternal Data Formula Feeding for Exclusion: No Has patient been taught Hand Expression?: Yes Does the patient have breastfeeding experience prior to this delivery?: Yes  Feeding Feeding Type: Breast Fed Length of feed: 15 min  LATCH Score Latch: Grasps breast easily, tongue down, lips flanged, rhythmical sucking.  Audible Swallowing: Spontaneous and intermittent  Type of Nipple: Everted at rest and after stimulation  Comfort (Breast/Nipple): Filling, red/small blisters or bruises, mild/mod discomfort  Hold (Positioning): No assistance needed to correctly position infant at breast.  LATCH Score:  9  Interventions Interventions: Breast feeding basics reviewed;Support pillows;Assisted with latch;Position options;Skin to skin;Expressed milk  Lactation Tools Discussed/Used WIC Program: No   Consult Status Consult Status: Complete    Silas FloodSharon S Ambrie Carte 04/22/2017, 11:12 AM

## 2018-02-15 IMAGING — DX DG ELBOW COMPLETE 3+V*R*
4 series · 4 of 4 positions shown · non-contrast
Comparison: Right forearm radiographs obtained at the same time.

CLINICAL DATA: Right elbow pain after falling from a ladder
yesterday.

EXAM:
RIGHT ELBOW - COMPLETE 3+ VIEW

[elbow ap]
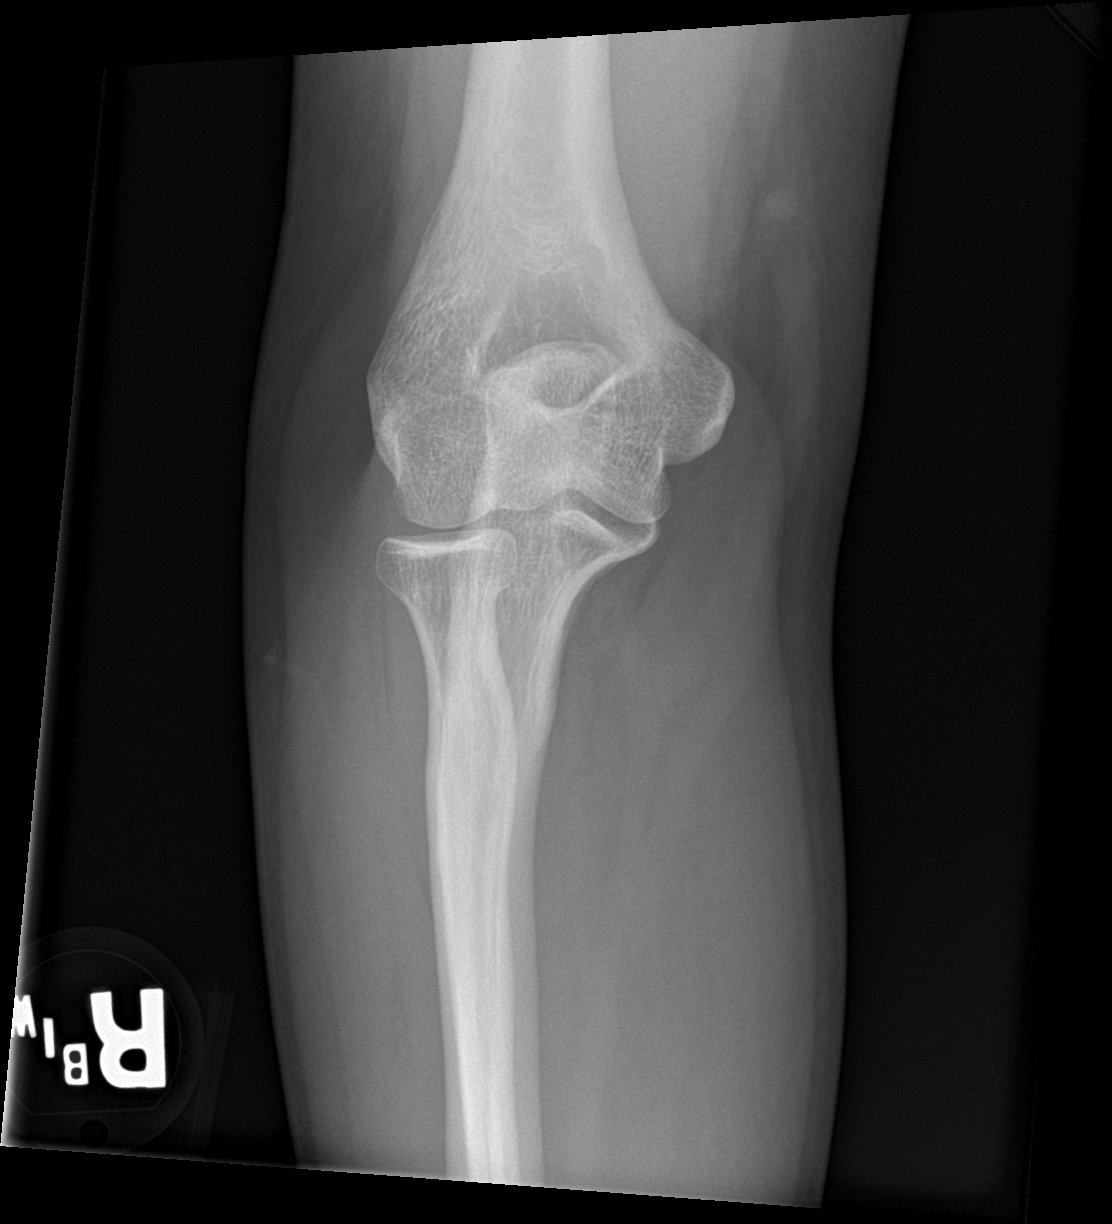

[elbow lat]
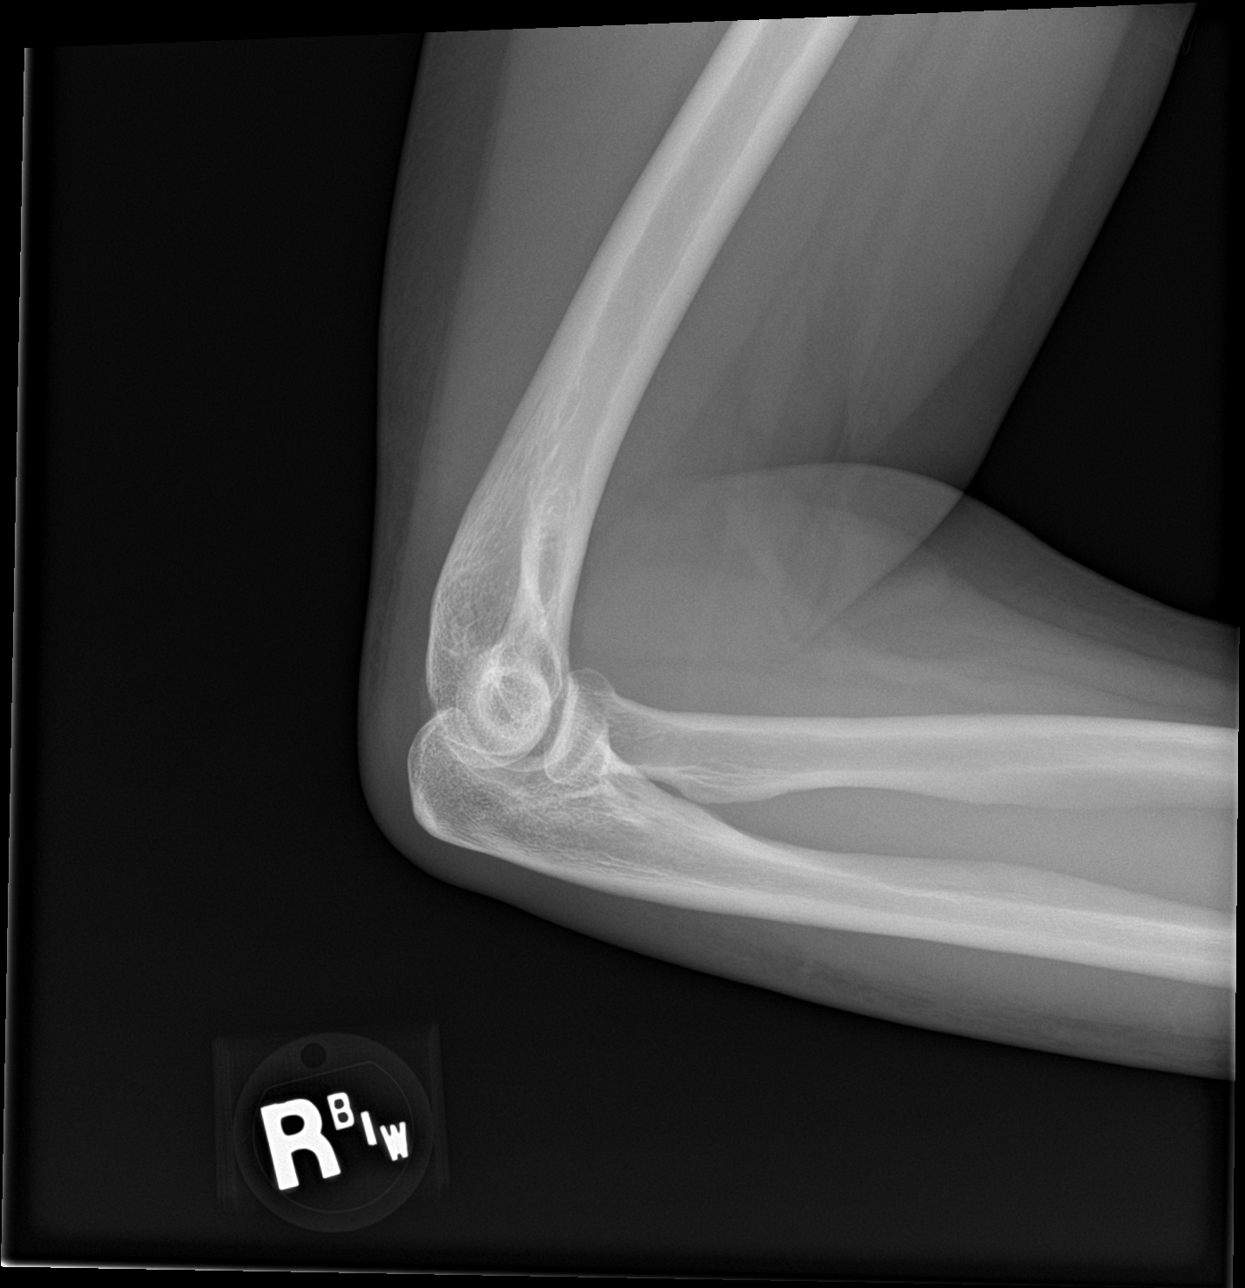

[elbow obl (1 of 2)]
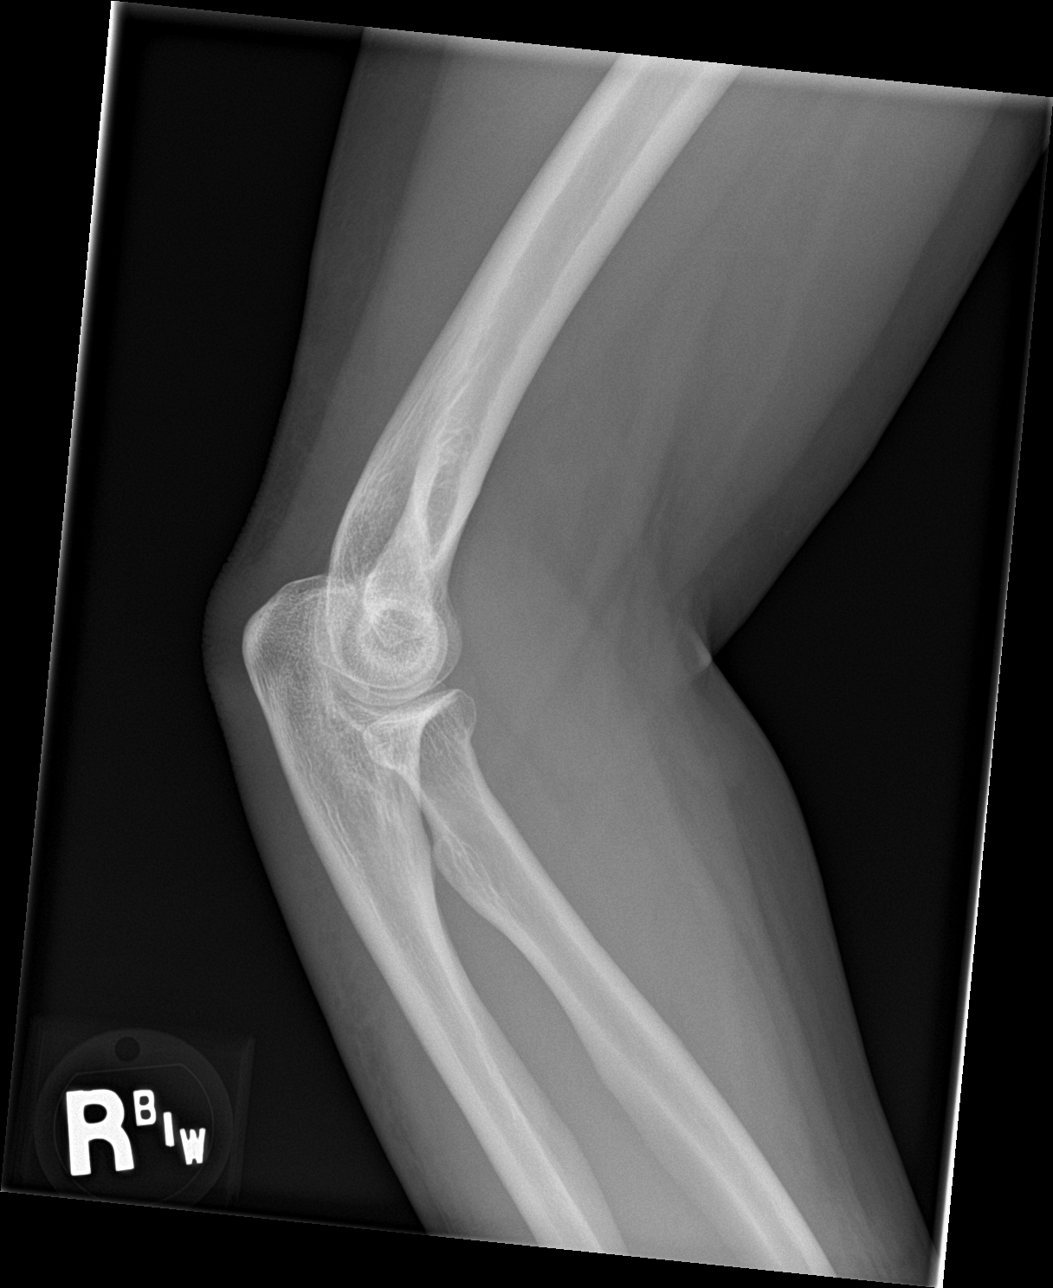

[elbow obl (2 of 2)]
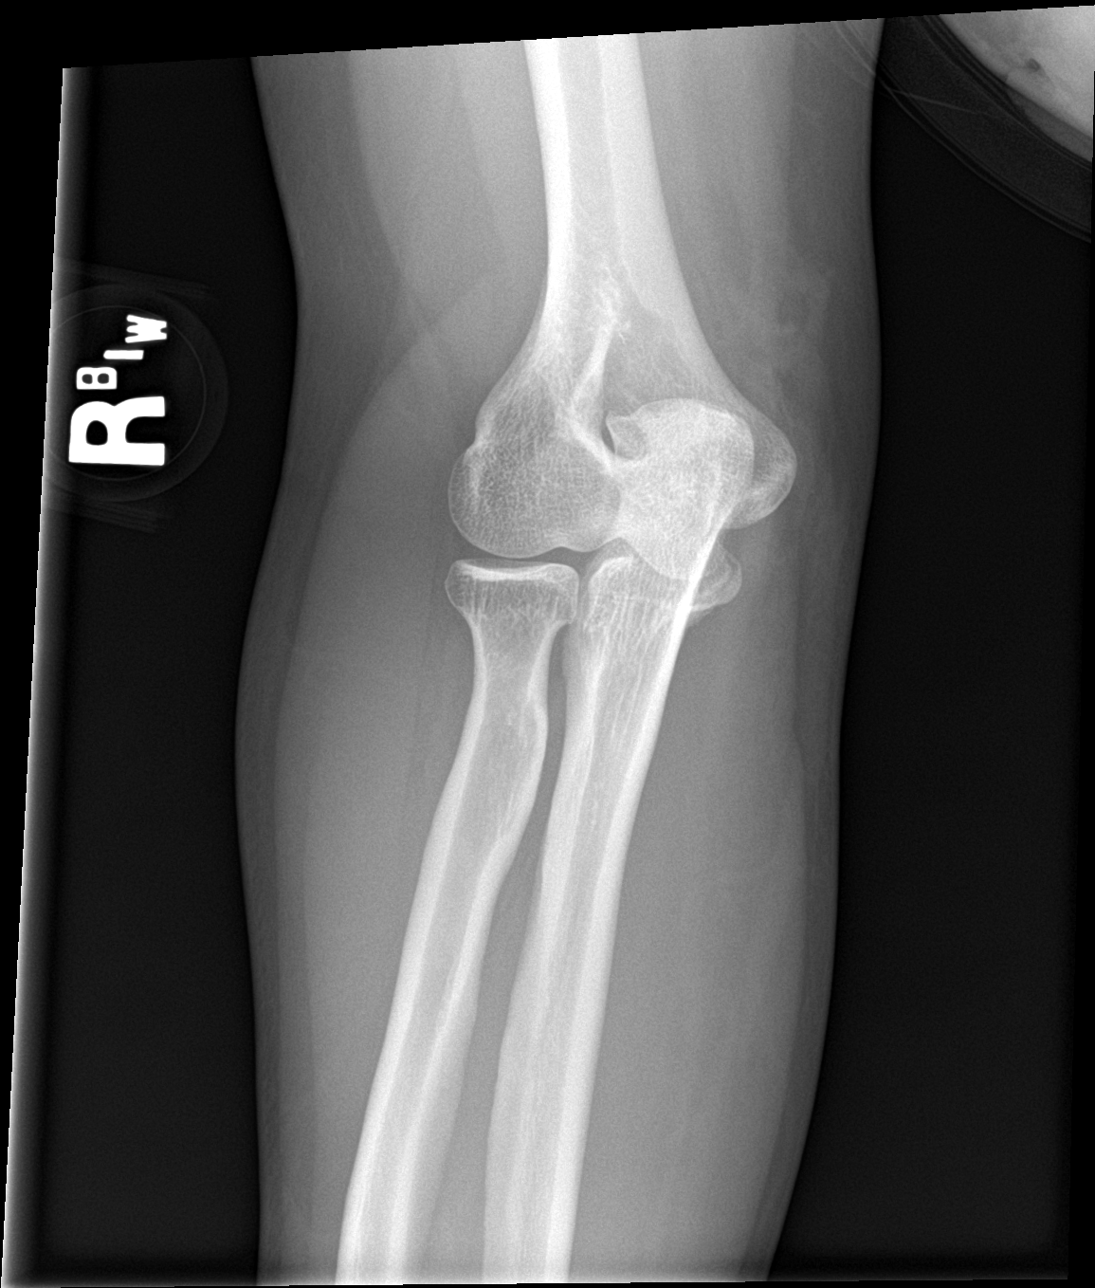

[4 of 4 positions shown; findings below may reference images not displayed]

FINDINGS: There is no evidence of fracture, dislocation, or joint effusion.
There is no evidence of arthropathy or other focal bone abnormality.
Soft tissues are unremarkable.
IMPRESSION: Normal examination.

## 2018-02-15 IMAGING — DX DG FOREARM 2V*R*
2 series · 2 of 2 positions shown · non-contrast
Comparison: Right wrist and elbow radiographs obtained at the same
time.

CLINICAL DATA: Right forearm and wrist pain following a fall from a
ladder yesterday.

EXAM:
RIGHT FOREARM - 2 VIEW

[forearm ap]
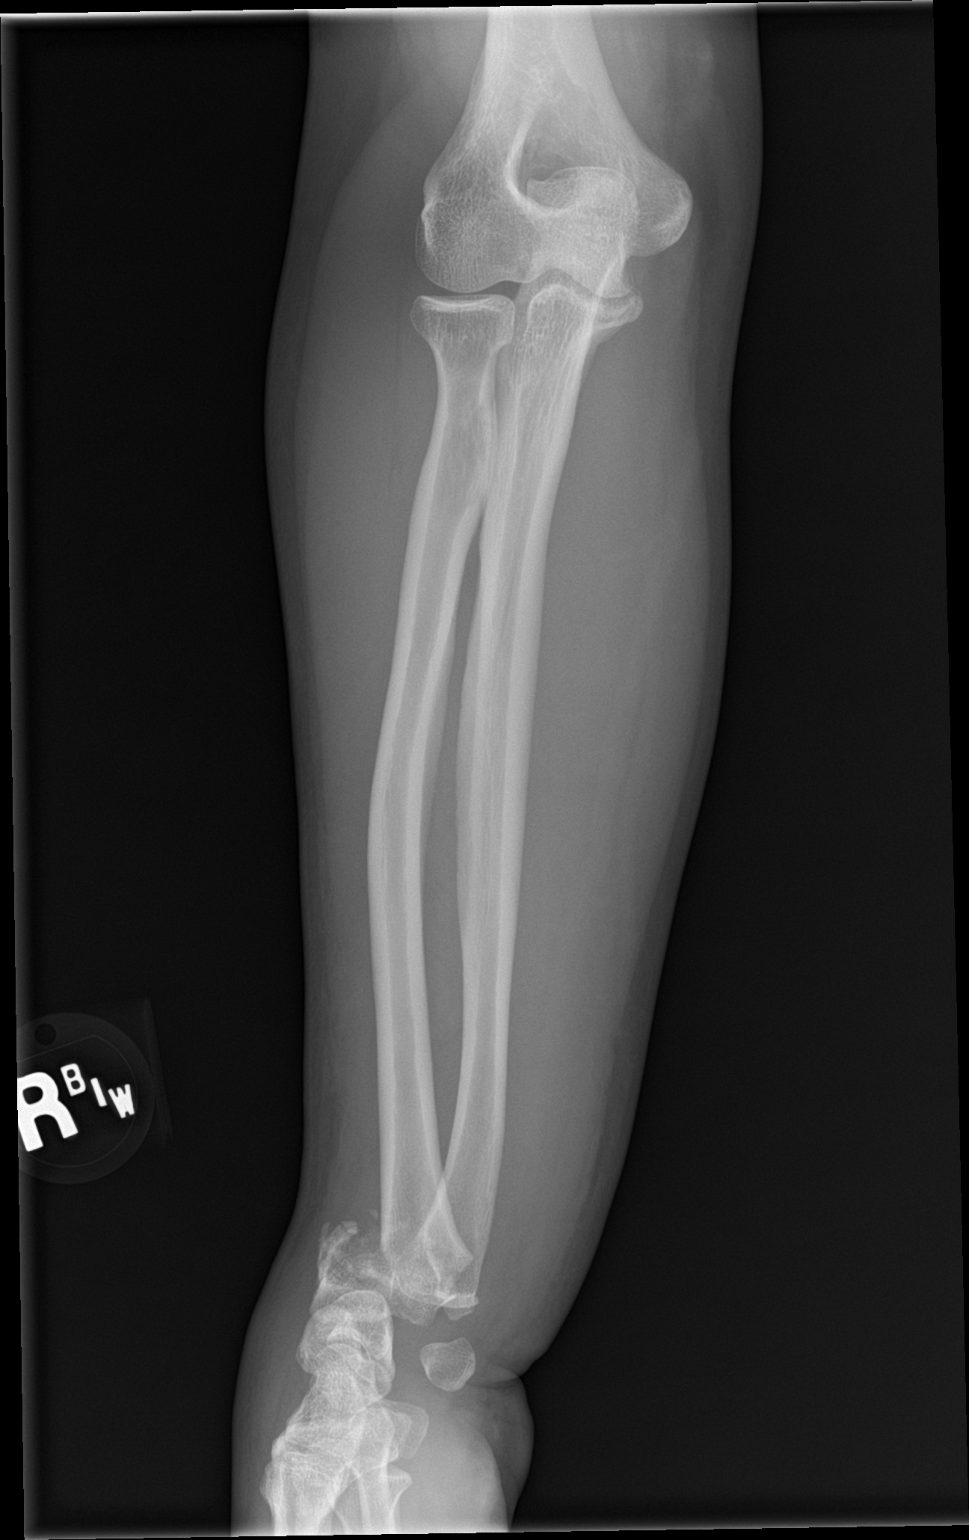

[forearm lat]
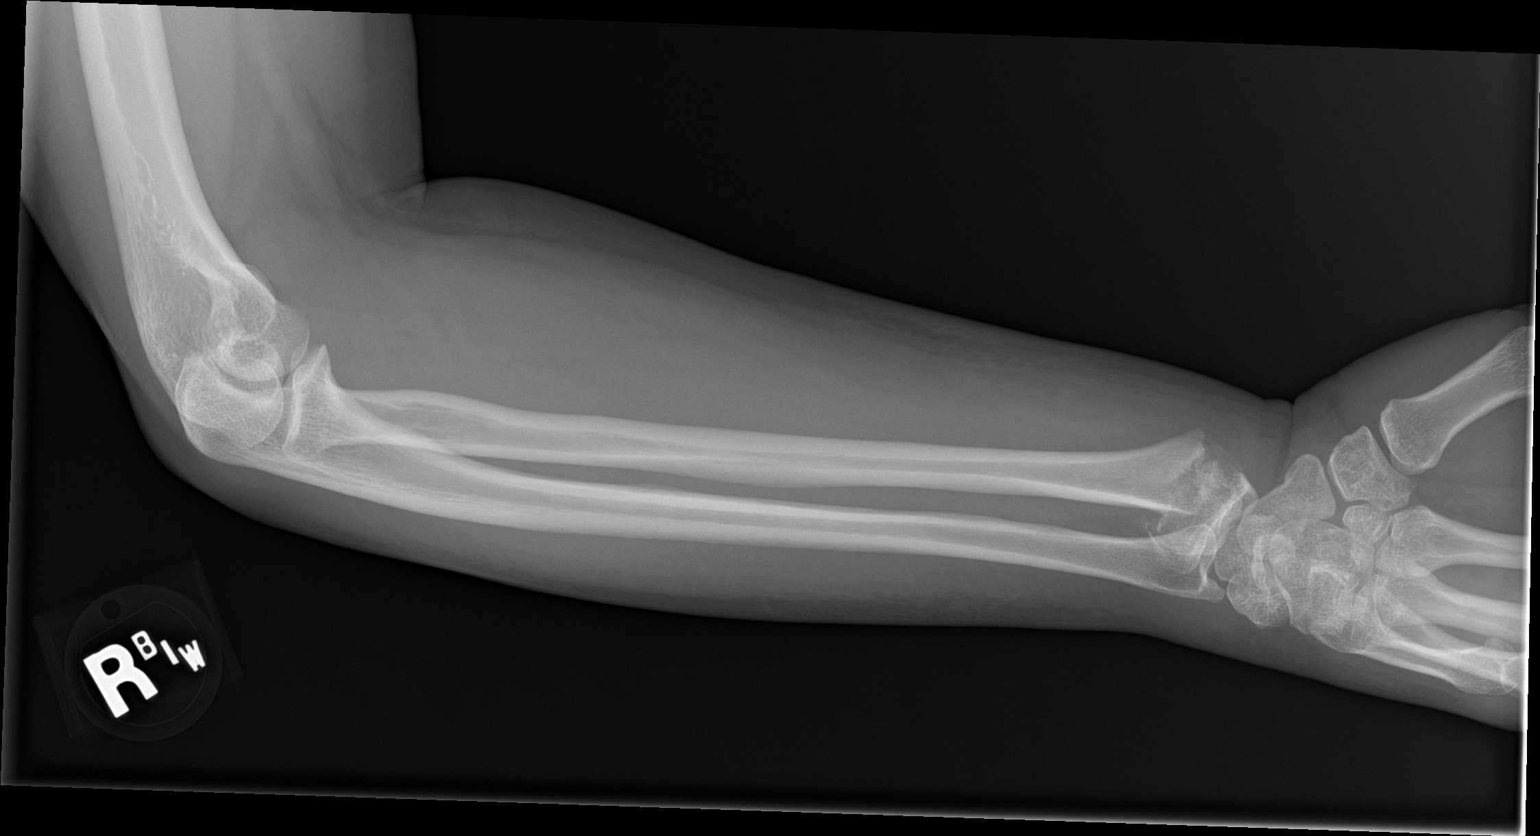

[2 of 2 positions shown; findings below may reference images not displayed]

FINDINGS: Comminuted fracture of the distal radius. This will be described in
the wrist radiographs report. No associated soft tissue swelling. No
other fractures are seen.
IMPRESSION: Comminuted distal radius fracture.

## 2021-05-12 ENCOUNTER — Ambulatory Visit: Payer: Self-pay | Admitting: Podiatry

## 2021-05-24 ENCOUNTER — Other Ambulatory Visit: Payer: Self-pay

## 2021-05-24 ENCOUNTER — Encounter: Payer: Self-pay | Admitting: Sports Medicine

## 2021-05-24 ENCOUNTER — Ambulatory Visit (INDEPENDENT_AMBULATORY_CARE_PROVIDER_SITE_OTHER): Payer: Self-pay

## 2021-05-24 ENCOUNTER — Ambulatory Visit (INDEPENDENT_AMBULATORY_CARE_PROVIDER_SITE_OTHER): Payer: Self-pay | Admitting: Sports Medicine

## 2021-05-24 DIAGNOSIS — M722 Plantar fascial fibromatosis: Secondary | ICD-10-CM

## 2021-05-24 DIAGNOSIS — S99911A Unspecified injury of right ankle, initial encounter: Secondary | ICD-10-CM

## 2021-05-24 DIAGNOSIS — O99019 Anemia complicating pregnancy, unspecified trimester: Secondary | ICD-10-CM | POA: Insufficient documentation

## 2021-05-24 DIAGNOSIS — M25571 Pain in right ankle and joints of right foot: Secondary | ICD-10-CM

## 2021-05-24 DIAGNOSIS — S93401A Sprain of unspecified ligament of right ankle, initial encounter: Secondary | ICD-10-CM

## 2021-05-24 DIAGNOSIS — Z8759 Personal history of other complications of pregnancy, childbirth and the puerperium: Secondary | ICD-10-CM | POA: Insufficient documentation

## 2021-05-24 DIAGNOSIS — S99921A Unspecified injury of right foot, initial encounter: Secondary | ICD-10-CM

## 2021-05-24 DIAGNOSIS — Z87442 Personal history of urinary calculi: Secondary | ICD-10-CM | POA: Insufficient documentation

## 2021-05-24 MED ORDER — DICLOFENAC SODIUM 75 MG PO TBEC: 75.0000 mg | DELAYED_RELEASE_TABLET | Freq: Two times a day (BID) | ORAL | 0 refills | Status: AC

## 2021-05-24 NOTE — Progress Notes (Signed)
Subjective: SHARMEL BALLANTINE is a 46 y.o. female patient presents to office with complaint of moderate pain to the right heel states that she has had episodes of the pain radiating into the arch and also admits to ankle pain states that she planted really hard when trying to step over a creek 2 weeks ago and had increased pain in the heel and at the ankle does not remember any direct inversion or eversion injury but does admit to a remote history of a prior injury to the right foot and ankle many years ago and is suspicious if the old injury has been aggravated.  Patient reports that she has tried several treatment of putting herself in a cam boot for 4 to 5 days rest ice elevation over-the-counter anti-inflammatories and periodically being on steroids for other problems with no relief.  Patient Active Problem List   Diagnosis Date Noted   Anemia of pregnancy 05/24/2021   History of postpartum hemorrhage 05/24/2021   History of renal calculi 05/24/2021   Gestational diabetes mellitus (GDM) in third trimester controlled on oral hypoglycemic drug 04/20/2017   Urolithiasis 01/12/2017   Pregnant 09/25/2016   Labor and delivery, indication for care 06/12/2015   SVD (spontaneous vaginal delivery) 06/12/2015   GDM, class A2 06/12/2015   Vaginal delivery 12/27/2012   AMA (advanced maternal age) multigravida 35+ 06/09/2012   Preterm delivery 06/09/2012   Cervical tear resulting from childbirth 06/09/2012   Kidney stone 06/09/2012   Hx of PTL (preterm labor) with 3rd pregnancy, term delivery 06/09/2012   Migraines during pregnancy 06/09/2012   Rapid first stage of labor 06/09/2012   Hx fractured coccyx with 3rd baby 06/09/2012   Missed abortion 2013 09/29/2011    No current outpatient medications on file prior to visit.   No current facility-administered medications on file prior to visit.    No Known Allergies  Objective: Physical Exam General: The patient is alert and oriented x3 in no  acute distress.  Dermatology: Skin is warm, dry and supple bilateral lower extremities. Nails 1-10 are normal. There is no eccymosis, no open lesions present. Integument is otherwise unremarkable.  Vascular: Dorsalis Pedis pulse and Posterior Tibial pulse are 1/4 bilateral. Capillary fill time is immediate to all digits.  Neurological: Grossly intact to light touch bilateral.  Musculoskeletal: Tenderness to palpation at the medial calcaneal tubercale and through the insertion of the plantar fascia on the right foot, there is pain at the lateral ankle and to the sinus tarsi of the right ankle.  No crepitus or major limitations with range of motion.  Strength appears to be adequate.   Xray, Right foot and ankle Normal osseous mineralization. Joint spaces preserved. No fracture/dislocation/boney destruction. Calcaneal spur present with mild thickening of plantar fascia. No other soft tissue abnormalities or radiopaque foreign bodies.   Assessment and Plan: Problem List Items Addressed This Visit   None Visit Diagnoses     Plantar fasciitis of right foot    -  Primary   Relevant Orders   DG Foot 2 Views Right   Injury of right foot, initial encounter       Relevant Orders   DG Ankle Complete Right   Injury of right ankle, initial encounter       Joint pain of ankle and foot, right           -Complete examination performed.  -Xrays reviewed -Discussed with patient in detail the condition of plantar fasciitis and continued pain in the foot and  ankle with remote history of prior injury, how this occurs and general treatment options. -Recommend at this time MRI for further evaluation for any possible underlying joint or soft tissue damage at this point I can only assume patient is dealing with a flareup however due to previous history and ongoing chronic pain it is concerning that there could possibly be some deeper soft tissue involvement; order placed for imaging to be done at [facility]  patient to contact them for self-pay cost -Rx Diclofenac to take as directed meanwhile -Recommended good supportive shoes and advised use of OTC Tri-Lock bracing to see if this will provide additional relief during the day as of 4 to the tendons to take stress and strain off of the painful areas -May continue with icing topical pain cream or rub and stretching as tolerated however if painful discontinue -Patient to return to office after MRI or sooner if issues arise.  Asencion Islam, DPM

## 2021-05-24 NOTE — Patient Instructions (Signed)
Trilock brace on [employer]

## 2021-05-25 ENCOUNTER — Telehealth: Payer: Self-pay | Admitting: *Deleted

## 2021-05-25 NOTE — Telephone Encounter (Signed)
Called and left a message for the patient stating I got the MRI scheduled at [facility] MRI center tomorrow 05-26-2021 at 2:15 pm and arrival time 2:00 pm. Misty Stanley

## 2021-05-26 ENCOUNTER — Other Ambulatory Visit: Payer: Self-pay | Admitting: Sports Medicine

## 2021-05-26 DIAGNOSIS — S99921A Unspecified injury of right foot, initial encounter: Secondary | ICD-10-CM

## 2021-05-26 DIAGNOSIS — S93401A Sprain of unspecified ligament of right ankle, initial encounter: Secondary | ICD-10-CM

## 2022-06-25 ENCOUNTER — Other Ambulatory Visit: Payer: Self-pay | Admitting: Emergency Medicine

## 2022-06-25 DIAGNOSIS — N23 Unspecified renal colic: Secondary | ICD-10-CM

## 2022-06-25 DIAGNOSIS — N63 Unspecified lump in unspecified breast: Secondary | ICD-10-CM

## 2022-06-25 DIAGNOSIS — R1013 Epigastric pain: Secondary | ICD-10-CM

## 2022-07-10 ENCOUNTER — Ambulatory Visit
Admission: RE | Admit: 2022-07-10 | Discharge: 2022-07-10 | Disposition: A | Discharge status: Home / Self Care | Payer: No Typology Code available for payment source | Source: Ambulatory Visit | Attending: Emergency Medicine | Admitting: Emergency Medicine

## 2022-07-10 DIAGNOSIS — R1013 Epigastric pain: Secondary | ICD-10-CM

## 2022-07-10 DIAGNOSIS — N23 Unspecified renal colic: Secondary | ICD-10-CM

## 2022-08-13 ENCOUNTER — Ambulatory Visit
Admission: RE | Admit: 2022-08-13 | Discharge: 2022-08-13 | Disposition: A | Discharge status: Home / Self Care | Payer: No Typology Code available for payment source | Source: Ambulatory Visit | Attending: Emergency Medicine | Admitting: Emergency Medicine

## 2022-08-13 ENCOUNTER — Ambulatory Visit
Admission: RE | Admit: 2022-08-13 | Discharge: 2022-08-13 | Disposition: A | Discharge status: Home / Self Care | Payer: BLUE CROSS/BLUE SHIELD | Source: Ambulatory Visit | Attending: Emergency Medicine | Admitting: Emergency Medicine

## 2022-08-13 DIAGNOSIS — N63 Unspecified lump in unspecified breast: Secondary | ICD-10-CM

## 2024-01-21 ENCOUNTER — Other Ambulatory Visit: Payer: Self-pay | Admitting: Emergency Medicine

## 2024-01-21 DIAGNOSIS — R109 Unspecified abdominal pain: Secondary | ICD-10-CM

## 2024-01-30 ENCOUNTER — Ambulatory Visit
Admission: RE | Admit: 2024-01-30 | Discharge: 2024-01-30 | Disposition: A | Discharge status: Home / Self Care | Payer: Self-pay | Source: Ambulatory Visit | Attending: Emergency Medicine | Admitting: Emergency Medicine

## 2024-01-30 DIAGNOSIS — R109 Unspecified abdominal pain: Secondary | ICD-10-CM
